# Patient Record
Sex: Female | Born: 1965 | Race: White | Hispanic: No | Marital: Married | Smoking: Never smoker
Health system: Southern US, Community
[De-identification: ages and names within clinical notes are randomized; demographics above are authoritative.]

## PROBLEM LIST (undated history)

## (undated) DIAGNOSIS — T7840XA Allergy, unspecified, initial encounter: Secondary | ICD-10-CM

## (undated) DIAGNOSIS — C801 Malignant (primary) neoplasm, unspecified: Secondary | ICD-10-CM

## (undated) DIAGNOSIS — E079 Disorder of thyroid, unspecified: Secondary | ICD-10-CM

## (undated) DIAGNOSIS — F419 Anxiety disorder, unspecified: Secondary | ICD-10-CM

## (undated) HISTORY — PX: POLYPECTOMY: SHX149

## (undated) HISTORY — DX: Allergy, unspecified, initial encounter: T78.40XA

## (undated) HISTORY — DX: Malignant (primary) neoplasm, unspecified: C80.1

## (undated) HISTORY — DX: Anxiety disorder, unspecified: F41.9

## (undated) HISTORY — PX: OTHER SURGICAL HISTORY: SHX169

## (undated) HISTORY — DX: Disorder of thyroid, unspecified: E07.9

## (undated) HISTORY — PX: COLONOSCOPY: SHX174

---

## 2009-12-16 ENCOUNTER — Encounter (INDEPENDENT_AMBULATORY_CARE_PROVIDER_SITE_OTHER): Payer: Self-pay | Admitting: *Deleted

## 2010-01-25 ENCOUNTER — Encounter (INDEPENDENT_AMBULATORY_CARE_PROVIDER_SITE_OTHER): Payer: Self-pay | Admitting: *Deleted

## 2010-01-25 ENCOUNTER — Ambulatory Visit: Payer: Self-pay | Admitting: Gastroenterology

## 2010-01-25 DIAGNOSIS — K625 Hemorrhage of anus and rectum: Secondary | ICD-10-CM

## 2010-01-25 DIAGNOSIS — K602 Anal fissure, unspecified: Secondary | ICD-10-CM

## 2010-03-10 ENCOUNTER — Ambulatory Visit
Admission: RE | Admit: 2010-03-10 | Discharge: 2010-03-10 | Payer: Self-pay | Source: Home / Self Care | Attending: Gastroenterology | Admitting: Gastroenterology

## 2010-03-10 ENCOUNTER — Encounter: Payer: Self-pay | Admitting: Gastroenterology

## 2010-03-14 ENCOUNTER — Encounter: Payer: Self-pay | Admitting: Gastroenterology

## 2010-03-22 ENCOUNTER — Telehealth: Payer: Self-pay | Admitting: Gastroenterology

## 2010-03-28 NOTE — Letter (Signed)
Summary: New Patient letter  [facility]  [address] [place], [place] [address]   Phone: [phone]  Fax: [phone]       12/16/2009 MRN: [id number]  Andrea Sharp [address] [place], [place]  [address]  Dear Ms. Eustice,  Welcome to the Gastroenterology Division at [facility].    You are scheduled to see Dr. Christella Hartigan on 01/25/2010 at 8:30AM on the 3rd floor at [facility], [address]  We ask that you try to arrive at our office 15 minutes prior to your appointment time to allow for check-in.  We would like you to complete the enclosed self-administered evaluation form prior to your visit and bring it with you on the day of your appointment.  We will review it with you.  Also, please bring a complete list of all your medications or, if you prefer, bring the medication bottles and we will list them.  Please bring your insurance card so that we may make a copy of it.  If your insurance requires a referral to see a specialist, please bring your referral form from your primary care physician.  Co-payments are due at the time of your visit and may be paid by cash, check or credit card.     Your office visit will consist of a consult with your physician (includes a physical exam), any laboratory testing he/she may order, scheduling of any necessary diagnostic testing (e.g. x-ray, ultrasound, CT-scan), and scheduling of a procedure (e.g. Endoscopy, Colonoscopy) if required.  Please allow enough time on your schedule to allow for any/all of these possibilities.    If you cannot keep your appointment, please call [phone] to cancel or reschedule prior to your appointment date.  This allows [place] the opportunity to schedule an appointment for another patient in need of care.  If you do not cancel or reschedule by 5 p.m. the business day prior to your appointment date, you will be charged a $50.00 late cancellation/no-show fee.    Thank you for  choosing  Gastroenterology for your medical needs.  We appreciate the opportunity to care for you.  Please visit [place] at our website  to learn more about our practice.                     Sincerely,                                                             The Gastroenterology Division

## 2010-03-28 NOTE — Assessment & Plan Note (Signed)
History of Present Illness Visit Type: Initial Consult Primary GI MD: Rob Bunting MD Primary Provider: Varney Baas, MD Requesting Provider: Varney Baas, MD Chief Complaint: rectal bleeding History of Present Illness:       very pleasant 45 year old woman.  she noted rectal bleeding about 2 monhts ago.  Had a skin cancer removed from face, put on Abx for 5 days, then diarrhea for 2 weeks (going 2-3 times a day, very loose).  Bleeding started shortly afterwards.  Her bowels are more normal now.  Has tearing pain at anus with all BMs, bleeds just about every time as well.  has tried prep H for a while, then stopped.  Cleaning with witch hazel.  Not on fiber supplements but does take dulcolax.  Never had colonoscopy. NO abd pains.  Overall she has lost 5-6 pounds (3 young kids, works).  she had a CBC about one to 2 months ago and it was normal.   GI Review of Systems      Denies abdominal pain, acid reflux, belching, bloating, chest pain, dysphagia with liquids, dysphagia with solids, heartburn, loss of appetite, nausea, vomiting, vomiting blood, weight loss, and  weight gain.        Denies anal fissure, black tarry stools, change in bowel habit, constipation, diarrhea, diverticulosis, fecal incontinence, heme positive stool, hemorrhoids, irritable bowel syndrome, jaundice, light color stool, liver problems, rectal bleeding, and  rectal pain.    Current Medications (verified): 1)  Synthroid 100 Mcg Tabs (Levothyroxine Sodium) .Marland Kitchen.. 1 By Mouth Once Daily 2)  Colace 100 Mg Caps (Docusate Sodium) .... Take One By Mouth Once Daily 3)  Multivitamins  Tabs (Multiple Vitamin) .... 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  All other review of systems was otherwise negative.   Vital Signs:  Patient profile:   45 year old female Height:      69 inches Weight:      142.4 pounds BMI:     21.10 Pulse rate:   88 / minute Pulse rhythm:   regular BP sitting:   112 / 68  (left arm) Cuff size:   regular  Vitals Entered By: Harlow Mares CMA Duncan Dull) (January 25, 2010 8:25 AM)  Physical Exam  Additional Exam:  Constitutional: generally well appearing Psychiatric: alert and oriented times 3 Eyes: extraocular movements intact Mouth: oropharynx moist, no lesions Neck: supple, no lymphadenopathy Cardiovascular: heart regular rate and rythm Lungs: CTA bilaterally Abdomen: soft, non-tender, non-distended, no obvious ascites, no peritoneal signs, normal bowel sounds Extremities: no lower extremity edema bilaterally Skin: no lesions on visible extremities rectal examination with female assistant in room: 1 cm, typical appearing posterior, midline anal fissure that was not currently bleeding. It was slightly tender. Internal palpation revealed no rectal masses. There are no hemorrhoids.   Impression & Recommendations:  Problem # 1:  anal fissure, rectal bleeding, anal pain I suspect her anal fissure is causing the bleeding  and the anal discomfort. Her father had colon polyps and she has never had a full colonoscopy and so we will arrange that to be done at her soonest convenience to make sure there are no other causes of her rectal bleeding. In the meantime she will begin usual seizure treatments with sits baths, topical prescription strength cream, fiber supplements.  Patient Instructions: 1)  You  will be scheduled to have a colonoscopy. 2)  You should begin taking citrucel powder fiber supplement (orange flavor).  Start with a small spoonful and increase this over 1 week to a full, heaping spoonful daily.  You may notice some bloating when you first start the fiber, but that usually resolves after a few days. 3)  Stop colace. 4)  Sitz baths, one to twice a day. 5)  Analpram ointment to fissure once to twice daily. 6)  A copy of this information will be sent to Dr. Jennette Kettle. 7)  The medication list was reviewed and reconciled.  All changed / newly prescribed medications were explained.  A complete medication list was provided to the patient / caregiver. Prescriptions: HYDROCORTISONE ACE-PRAMOXINE 2.5-1 % CREA (HYDROCORTISONE ACE-PRAMOXINE) apply to anus once to twice daily  #1 month x 3   Entered and Authorized by:   Rachael Fee MD   Signed by:   Rachael Fee MD on 01/25/2010   Method used:   Electronically to        [facility] # [phone]* (retail)       [address]       [place], [place]  [address]       Ph: [phone]       Fax: [phone]   RxID:   [id number]   Appended Document: Orders Update/Movi    Clinical Lists Changes  Problems: Added new problem of HEMORRHAGE OF RECTUM AND ANUS (ICD-569.3) Added new problem of ANAL FISSURE (ICD-565.0) Medications: Added new medication of MOVIPREP 100 GM  SOLR (PEG-KCL-NACL-NASULF-NA ASC-C) As per prep instructions. - Signed Rx of MOVIPREP 100 GM  SOLR (PEG-KCL-NACL-NASULF-NA ASC-C) As per prep instructions.;  #1 x 0;  Signed;  Entered by: Chales Abrahams CMA (AAMA);  Authorized by: Rachael Fee MD;  Method used: Electronically to [facility] # [address]*, [address] [place], [place]  [address], Ph: [phone], Fax: [phone] Orders: Added new Test order of Colonoscopy (Colon) - Signed    Prescriptions: MOVIPREP 100 GM  SOLR (PEG-KCL-NACL-NASULF-NA ASC-C) As per prep instructions.  #1  x 0   Entered by:   Chales Abrahams CMA (AAMA)   Authorized by:   Rachael Fee MD   Signed by:   Chales Abrahams CMA (AAMA) on 01/25/2010   Method used:   Electronically to        [facility] # [address]* (retail)       [address]       [place], [place]  [address]       Ph: [phone]       Fax: [phone]   RxID:   [id number]

## 2010-03-28 NOTE — Letter (Signed)
Summary: [facility] Instructions  [facility]  [address] [place], [place] [address]   Phone: [phone]  Fax: [phone]       [name]    [birth year]    MRN: [id number]        Procedure Day /Date:03/10/10 FRI     Arrival Time:2 pm     Procedure Time:3 pm     Location of Procedure:                    X  [facility] (4th Floor)                        PREPARATION FOR COLONOSCOPY WITH MOVIPREP   Starting 5 days prior to your procedure 03/05/10 do not eat nuts, seeds, popcorn, corn, beans, peas,  salads, or any raw vegetables.  Do not take any fiber supplements (e.g. Metamucil, Citrucel, and Benefiber).  THE DAY BEFORE YOUR PROCEDURE         DATE:03/09/10  DAY: THURS  1.  Drink clear liquids the entire day-NO SOLID FOOD  2.  Do not drink anything colored red or purple.  Avoid juices with pulp.  No orange juice.  3.  Drink at least 64 oz. (8 glasses) of fluid/clear liquids during the day to prevent dehydration and help the prep work efficiently.  CLEAR LIQUIDS INCLUDE: Water Jello Ice Popsicles Tea (sugar ok, no milk/cream) Powdered fruit flavored drinks Coffee (sugar ok, no milk/cream) Gatorade Juice: apple, white grape, white cranberry  Lemonade Clear bullion, consomm, broth Carbonated beverages (any kind) Strained chicken noodle soup Hard Candy                             4.  In the morning, mix first dose of MoviPrep solution:    Empty 1 Pouch A and 1 Pouch B into the disposable container    Add lukewarm drinking water to the top line of the container. Mix to dissolve    Refrigerate (mixed solution should be used within 24 hrs)  5.  Begin drinking the prep at 5:00 p.m. The MoviPrep container is divided by 4 marks.   Every 15 minutes drink the solution down to the next mark (approximately 8 oz) until the full liter is complete.   6.  Follow completed prep with 16 oz of clear liquid of your choice (Nothing red or purple).   Continue to drink clear liquids until bedtime.  7.  Before going to bed, mix second dose of MoviPrep solution:    Empty 1 Pouch A and 1 Pouch B into the disposable container    Add lukewarm drinking water to the top line of the container. Mix to dissolve    Refrigerate  THE DAY OF YOUR PROCEDURE      DATE: 03/10/10 DAY: Andrea Sharp  Beginning at 10 a.m. (5 hours before procedure):         1. Every 15 minutes, drink the solution down to the next mark (approx 8 oz) until the full liter is complete.  2. Follow completed prep with 16 oz. of clear liquid of your choice.    3. You may drink clear liquids until 1 pm (2 HOURS BEFORE PROCEDURE).   MEDICATION INSTRUCTIONS  Unless otherwise instructed, you should take regular prescription medications with a small sip of water   as early as possible the morning of your procedure.  OTHER INSTRUCTIONS  You will need a responsible adult at least 43 years of age to accompany you and drive you home.   This person must remain in the waiting room during your procedure.  Wear loose fitting clothing that is easily removed.  Leave jewelry and other valuables at home.  However, you may wish to bring a book to read or  an iPod/MP3 player to listen to music as you wait for your procedure to start.  Remove all body piercing jewelry and leave at home.  Total time from sign-in until discharge is approximately 2-3 hours.  You should go home directly after your procedure and rest.  You can resume normal activities the  day after your procedure.  The day of your procedure you should not:   Drive   Make legal decisions   Operate machinery   Drink alcohol   Return to work  You will receive specific instructions about eating, activities and medications before you leave.    The above instructions have been reviewed and explained to me by   _______________________    I fully understand and can verbalize these instructions  _____________________________ Date _________

## 2010-03-30 NOTE — Progress Notes (Signed)
Summary: Questions about biopsy results  Phone Note Call from Patient Call back at Home Phone [phone]   Caller: Patient Call For: Dr. Christella Hartigan Reason for Call: Talk to Nurse Summary of Call: Has some questions about her biopsy results Initial call taken by: Karna Christmas,  March 22, 2010 9:06 AM  Follow-up for Phone Call        unable to reach pt the number given has been set up to not accept blocked calls, I tried to use the *82 and was still unable to reach the pt. Follow-up by: Chales Abrahams CMA (AAMA),  March 22, 2010 10:12 AM

## 2010-03-30 NOTE — Procedures (Signed)
Summary: Colonoscopy  Patient: Andrea Sharp Note: All result statuses are Final unless otherwise noted.  Tests: (1) Colonoscopy (COL)   COL Colonoscopy           DONE     [facility]     [address]     [place], [place]  [address]           COLONOSCOPY PROCEDURE REPORT           PATIENT:  Andrea Sharp, Andrea Sharp  MR#:  [id number]     BIRTHDATE:  February 17, 1966, 44 yrs. old  GENDER:  female     ENDOSCOPIST:  Rachael Fee, MD     REF. BY:  Varney Baas, M.D.     PROCEDURE DATE:  03/10/2010     PROCEDURE:  Colonoscopy with snare polypectomy     ASA CLASS:  Class II     INDICATIONS:  father had colon polyps, intermittent rectal     bleeding     MEDICATIONS:   Fentanyl 50 mcg IV, Versed 5 mg IV           DESCRIPTION OF PROCEDURE:   After the risks benefits and     alternatives of the procedure were thoroughly explained, informed     consent was obtained.  Digital rectal exam was performed and     revealed no rectal masses.   The LB [id number] endoscope     was introduced through the anus and advanced to the cecum, which     was identified by both the appendix and ileocecal valve, without     limitations.  The quality of the prep was good, using MoviPrep.     The instrument was then slowly withdrawn as the colon was fully     examined.     <<PROCEDUREIMAGES>>           FINDINGS:  A pedunculated polyp was found in the ascending colon.     This was 13mm across, was removed with snare/cautery and sent to     pathology (jar 1) (see image3 and image4).  This was otherwise a     normal examination of the colon (see image5, image2, and image1).     Retroflexed views in the rectum revealed no abnormalities.    The     scope was then withdrawn from the patient and the procedure     completed.           COMPLICATIONS:  None     ENDOSCOPIC IMPRESSION:     1) Pedunculated polyp in the ascending colon, removed and sent     to pathology     2) Otherwise normal examination       RECOMMENDATIONS:     1) If the polyp(s) removed today are proven to be adenomatous     (pre-cancerous) polyps, you will need a colonoscopy in 3 years.     Otherwise you should continue to follow colorectal cancer     screening guidelines for "routine risk" patients with a     colonoscopy in 10 years.     2) You will receive a letter within 1-2 weeks with the results     of your biopsy as well as final recommendations. Please call my     office if you have not received a letter after 3 weeks.           ______________________________     Rachael Fee, MD  n.     eSIGNED:   Rachael Fee at 03/10/2010 02:58 PM           Danice Goltz, [id number]  Note: An exclamation mark (!) indicates a result that was not dispersed into the flowsheet. Document Creation Date: 03/10/2010 2:58 PM _______________________________________________________________________  (1) Order result status: Final Collection or observation date-time: 03/10/2010 14:54 Requested date-time:  Receipt date-time:  Reported date-time:  Referring Physician:   Ordering Physician: Rob Bunting [phone]) Specimen Source:  Source: Launa Grill Order Number: [id number] Lab site:   Appended Document: Colonoscopy     Procedures Next Due Date:    Colonoscopy: 02/2013

## 2010-03-30 NOTE — Letter (Signed)
Summary: Results Letter  [facility]  [address] [place], [place] [address]   Phone: [phone]  Fax: [phone]        March 14, 2010 MRN: [id number]    SERAPHIM AFFINITO [address] [place], [place]  [address]    Dear Ms. Shadle,   The polyp removed during your recent procedure was proven to be adenomatous.  These are pre-cancerous polyps that may have grown into cancers if they had not been removed.  Based on current nationally recognized surveillance guidelines, I recommend that you have a repeat colonoscopy in 3 years.   We will therefore put your information in our reminder system and will contact you in 3 years to schedule a repeat procedure.  Please call if you have any questions or concerns.       Sincerely,  Rachael Fee MD  This letter has been electronically signed by your physician.  Appended Document: Results Letter Letter mailed

## 2010-09-05 ENCOUNTER — Other Ambulatory Visit: Payer: Self-pay | Admitting: Dermatology

## 2010-12-14 ENCOUNTER — Other Ambulatory Visit: Payer: Self-pay | Admitting: Dermatology

## 2011-11-07 ENCOUNTER — Other Ambulatory Visit: Payer: Self-pay

## 2012-12-08 ENCOUNTER — Other Ambulatory Visit: Payer: Self-pay | Admitting: Obstetrics & Gynecology

## 2012-12-08 DIAGNOSIS — R922 Inconclusive mammogram: Secondary | ICD-10-CM

## 2012-12-08 DIAGNOSIS — Z803 Family history of malignant neoplasm of breast: Secondary | ICD-10-CM

## 2013-01-09 ENCOUNTER — Encounter: Payer: Self-pay | Admitting: Gastroenterology

## 2013-01-14 ENCOUNTER — Encounter: Payer: Self-pay | Admitting: Gastroenterology

## 2013-03-09 ENCOUNTER — Ambulatory Visit (AMBULATORY_SURGERY_CENTER): Payer: Self-pay | Admitting: *Deleted

## 2013-03-09 VITALS — Ht 69.0 in | Wt 147.0 lb

## 2013-03-09 DIAGNOSIS — Z860100 Personal history of colon polyps, unspecified: Secondary | ICD-10-CM

## 2013-03-09 MED ORDER — MOVIPREP 100 G PO SOLR: ORAL | Status: DC

## 2013-03-09 NOTE — Progress Notes (Signed)
Patient denies any allergies to eggs or soy. Patient denies any problems with anesthesia.  

## 2013-03-11 ENCOUNTER — Encounter: Payer: Self-pay | Admitting: Gastroenterology

## 2013-03-23 ENCOUNTER — Ambulatory Visit (AMBULATORY_SURGERY_CENTER): Payer: BC Managed Care – PPO | Admitting: Gastroenterology

## 2013-03-23 ENCOUNTER — Encounter: Payer: Self-pay | Admitting: Gastroenterology

## 2013-03-23 VITALS — BP 112/73 | HR 71 | Temp 97.7°F | Resp 15 | Ht 69.0 in | Wt 147.0 lb

## 2013-03-23 DIAGNOSIS — Z860100 Personal history of colon polyps, unspecified: Secondary | ICD-10-CM

## 2013-03-23 MED ORDER — SODIUM CHLORIDE 0.9 % IV SOLN: 500.0000 mL | INTRAVENOUS | Status: DC

## 2013-03-23 NOTE — Op Note (Signed)
[facility]  [address] [place], [address]   COLONOSCOPY PROCEDURE REPORT  PATIENT: Andrea Sharp, Andrea Sharp  MR#: [id number] BIRTHDATE: [birth year] , 89  yrs. old GENDER: Female ENDOSCOPIST: Milus Banister, MD PROCEDURE DATE:  03/23/2013 PROCEDURE:   Colonoscopy, surveillance First Screening Colonoscopy - Avg.  risk and is 50 yrs.  old or older - No.  Prior Negative Screening - Now for repeat screening. N/A  History of Adenoma - Now for follow-up colonoscopy & has been > or = to 3 yrs.  Yes hx of adenoma.  Has been 3 or more years since last colonoscopy.  Polyps Removed Today? No.  Recommend repeat exam, <10 yrs? Yes.  High risk (family or personal hx). ASA CLASS:   Class II INDICATIONS:20mm TVA removed in 2012 (done for rectal bleeding). MEDICATIONS: Fentanyl 50 mcg IV, Versed 6 mg IV, and These medications were titrated to patient response per physician's verbal order  DESCRIPTION OF PROCEDURE:   After the risks benefits and alternatives of the procedure were thoroughly explained, informed consent was obtained.  A digital rectal exam revealed no abnormalities of the rectum.   The Pentax Pediatric Colonoscope [id number]  endoscope was introduced through the anus and advanced to the cecum, which was identified by both the appendix and ileocecal valve. No adverse events experienced.   The quality of the prep was good.  The instrument was then slowly withdrawn as the colon was fully examined.   COLON FINDINGS: A normal appearing cecum, ileocecal valve, and appendiceal orifice were identified.  The ascending, hepatic flexure, transverse, splenic flexure, descending, sigmoid colon and rectum appeared unremarkable.  No polyps or cancers were seen. Retroflexed views revealed no abnormalities. The time to cecum=4 minutes 30 seconds.  Withdrawal time=9 minutes 20 seconds.  The scope was withdrawn and the procedure completed. COMPLICATIONS: There were no  complications.  ENDOSCOPIC IMPRESSION: Normal colon No polyps or cancers  RECOMMENDATIONS: Given your personal history of adenomatous (pre-cancerous) polyps, you will need a repeat colonoscopy in 5 years.   eSigned:  Milus Banister, MD 03/23/2013 9:45 AM   cc: Lujean Amel, MD

## 2013-03-23 NOTE — Patient Instructions (Signed)
YOU HAD AN ENDOSCOPIC PROCEDURE TODAY AT THE [facility]: Refer to the procedure report that was given to you for any specific questions about what was found during the examination.  If the procedure report does not answer your questions, please call your gastroenterologist to clarify.  If you requested that your care partner not be given the details of your procedure findings, then the procedure report has been included in a sealed envelope for you to review at your convenience later.  YOU SHOULD EXPECT: Some feelings of bloating in the abdomen. Passage of more gas than usual.  Walking can help get rid of the air that was put into your GI tract during the procedure and reduce the bloating. If you had a lower endoscopy (such as a colonoscopy or flexible sigmoidoscopy) you may notice spotting of blood in your stool or on the toilet paper. If you underwent a bowel prep for your procedure, then you may not have a normal bowel movement for a few days.  DIET: Your first meal following the procedure should be a light meal and then it is ok to progress to your normal diet.  A half-sandwich or bowl of soup is an example of a good first meal.  Heavy or fried foods are harder to digest and may make you feel nauseous or bloated.  Likewise meals heavy in dairy and vegetables can cause extra gas to form and this can also increase the bloating.  Drink plenty of fluids but you should avoid alcoholic beverages for 24 hours.  ACTIVITY: Your care partner should take you home directly after the procedure.  You should plan to take it easy, moving slowly for the rest of the day.  You can resume normal activity the day after the procedure however you should NOT DRIVE or use heavy machinery for 24 hours (because of the sedation medicines used during the test).    SYMPTOMS TO REPORT IMMEDIATELY: A gastroenterologist can be reached at any hour.  During normal business hours, 8:30 AM to 5:00 PM Monday through Friday,  call [phone].  After hours and on weekends, please call the GI answering service at [phone] who will take a message and have the physician on call contact you.   Following lower endoscopy (colonoscopy or flexible sigmoidoscopy):  Excessive amounts of blood in the stool  Significant tenderness or worsening of abdominal pains  Swelling of the abdomen that is new, acute  Fever of 100F or higher    FOLLOW UP: If any biopsies were taken you will be contacted by phone or by letter within the next 1-3 weeks.  Call your gastroenterologist if you have not heard about the biopsies in 3 weeks.  Our staff will call the home number listed on your records the next business day following your procedure to check on you and address any questions or concerns that you may have at that time regarding the information given to you following your procedure. This is a courtesy call and so if there is no answer at the home number and we have not heard from you through the emergency physician on call, we will assume that you have returned to your regular daily activities without incident.  SIGNATURES/CONFIDENTIALITY: You and/or your care partner have signed paperwork which will be entered into your electronic medical record.  These signatures attest to the fact that that the information above on your After Visit Summary has been reviewed and is understood.  Full responsibility of the confidentiality   of this discharge information lies with you and/or your care-partner.     

## 2013-03-24 ENCOUNTER — Telehealth: Payer: Self-pay | Admitting: *Deleted

## 2013-03-24 NOTE — Telephone Encounter (Signed)
Name identifier, left message, follow-up 

## 2013-04-06 ENCOUNTER — Ambulatory Visit
Admission: RE | Admit: 2013-04-06 | Discharge: 2013-04-06 | Disposition: A | Discharge status: Home / Self Care | Payer: BC Managed Care – PPO | Source: Ambulatory Visit | Attending: Obstetrics & Gynecology | Admitting: Obstetrics & Gynecology

## 2013-04-06 DIAGNOSIS — Z803 Family history of malignant neoplasm of breast: Secondary | ICD-10-CM

## 2013-04-06 DIAGNOSIS — R922 Inconclusive mammogram: Secondary | ICD-10-CM

## 2013-04-06 MED ORDER — GADOBENATE DIMEGLUMINE 529 MG/ML IV SOLN
14.0000 mL | Freq: Once | INTRAVENOUS | Status: AC | PRN
  Administered 2013-04-06: 14 mL via INTRAVENOUS

## 2013-11-11 ENCOUNTER — Encounter: Payer: Self-pay | Admitting: Gastroenterology

## 2013-11-23 ENCOUNTER — Other Ambulatory Visit: Payer: Self-pay | Admitting: Obstetrics & Gynecology

## 2013-11-25 LAB — CYTOLOGY - PAP

## 2014-11-30 ENCOUNTER — Other Ambulatory Visit: Payer: Self-pay | Admitting: Obstetrics & Gynecology

## 2014-12-01 LAB — CYTOLOGY - PAP

## 2018-03-02 ENCOUNTER — Encounter: Payer: Self-pay | Admitting: Gastroenterology

## 2018-03-28 ENCOUNTER — Encounter: Payer: Self-pay | Admitting: Gastroenterology

## 2018-04-16 ENCOUNTER — Encounter: Payer: Self-pay | Admitting: Gastroenterology

## 2018-04-16 ENCOUNTER — Ambulatory Visit (AMBULATORY_SURGERY_CENTER): Payer: Self-pay | Admitting: *Deleted

## 2018-04-16 VITALS — Ht 69.0 in | Wt 148.0 lb

## 2018-04-16 DIAGNOSIS — Z86010 Personal history of colonic polyps: Secondary | ICD-10-CM

## 2018-04-16 MED ORDER — PEG 3350-KCL-NA BICARB-NACL 420 G PO SOLR: 4000.0000 mL | Freq: Once | ORAL | 0 refills | Status: AC

## 2018-04-16 NOTE — Progress Notes (Signed)
No egg or soy allergy known to patient  No issues with past sedation with any surgeries  or procedures, no past intubation  No diet pills per patient No home 02 use per patient  No blood thinners per patient  Pt denies issues with constipation  No A fib or A flutter  EMMI video sent to pt's e mail -- pt declined   

## 2018-04-30 ENCOUNTER — Encounter: Payer: Self-pay | Admitting: Gastroenterology

## 2018-04-30 ENCOUNTER — Ambulatory Visit (AMBULATORY_SURGERY_CENTER): Payer: BC Managed Care – PPO | Admitting: Gastroenterology

## 2018-04-30 VITALS — BP 115/81 | HR 60 | Temp 98.0°F | Resp 20 | Ht 69.0 in | Wt 148.0 lb

## 2018-04-30 DIAGNOSIS — Z860100 Personal history of colon polyps, unspecified: Secondary | ICD-10-CM

## 2018-04-30 DIAGNOSIS — K621 Rectal polyp: Secondary | ICD-10-CM

## 2018-04-30 DIAGNOSIS — D129 Benign neoplasm of anus and anal canal: Secondary | ICD-10-CM

## 2018-04-30 DIAGNOSIS — D128 Benign neoplasm of rectum: Secondary | ICD-10-CM

## 2018-04-30 MED ORDER — SODIUM CHLORIDE 0.9 % IV SOLN: 500.0000 mL | Freq: Once | INTRAVENOUS | Status: DC

## 2018-04-30 NOTE — Progress Notes (Signed)
Report to PACU, RN, vss, BBS= Clear.  

## 2018-04-30 NOTE — Patient Instructions (Addendum)
Discharge instructions given. Handouts on polyps. Resume previous medications.  YOU HAD AN ENDOSCOPIC PROCEDURE TODAY AT [facility]:   Refer to the procedure report that was given to you for any specific questions about what was found during the examination.  If the procedure report does not answer your questions, please call your gastroenterologist to clarify.  If you requested that your care partner not be given the details of your procedure findings, then the procedure report has been included in a sealed envelope for you to review at your convenience later.  YOU SHOULD EXPECT: Some feelings of bloating in the abdomen. Passage of more gas than usual.  Walking can help get rid of the air that was put into your GI tract during the procedure and reduce the bloating. If you had a lower endoscopy (such as a colonoscopy or flexible sigmoidoscopy) you may notice spotting of blood in your stool or on the toilet paper. If you underwent a bowel prep for your procedure, you may not have a normal bowel movement for a few days.  Please Note:  You might notice some irritation and congestion in your nose or some drainage.  This is from the oxygen used during your procedure.  There is no need for concern and it should clear up in a day or so.  SYMPTOMS TO REPORT IMMEDIATELY:   Following lower endoscopy (colonoscopy or flexible sigmoidoscopy):  Excessive amounts of blood in the stool  Significant tenderness or worsening of abdominal pains  Swelling of the abdomen that is new, acute  Fever of 100F or higher   For urgent or emergent issues, a gastroenterologist can be reached at any hour by calling [phone].   DIET:  We do recommend a small meal at first, but then you may proceed to your regular diet.  Drink plenty of fluids but you should avoid alcoholic beverages for 24 hours.  ACTIVITY:  You should plan to take it easy for the rest of today and you should NOT DRIVE or use heavy  machinery until tomorrow (because of the sedation medicines used during the test).    FOLLOW UP: Our staff will call the number listed on your records the next business day following your procedure to check on you and address any questions or concerns that you may have regarding the information given to you following your procedure. If we do not reach you, we will leave a message.  However, if you are feeling well and you are not experiencing any problems, there is no need to return our call.  We will assume that you have returned to your regular daily activities without incident.  If any biopsies were taken you will be contacted by phone or by letter within the next 1-3 weeks.  Please call us at [phone] if you have not heard about the biopsies in 3 weeks.    SIGNATURES/CONFIDENTIALITY: You and/or your care partner have signed paperwork which will be entered into your electronic medical record.  These signatures attest to the fact that that the information above on your After Visit Summary has been reviewed and is understood.  Full responsibility of the confidentiality of this discharge information lies with you and/or your care-partner.

## 2018-04-30 NOTE — Progress Notes (Signed)
Called to room to assist during endoscopic procedure.  Patient ID and intended procedure confirmed with present staff. Received instructions for my participation in the procedure from the performing physician.  

## 2018-04-30 NOTE — Op Note (Signed)
[facility] Patient Name: Andrea Sharp Procedure Date: 04/30/2018 8:59 AM MRN: [id number] Endoscopist: Milus Banister , MD Age: 53 Referring MD:  Date of Birth: March 02, 1965 Gender: Female Account #: [id number] Procedure:                Colonoscopy Indications:              High risk colon cancer surveillance: Personal                            history of colonic polyps; colonoscopy 2012 single                            72mm pedunculated TVA removed, colonoscopy 2015                            normal Medicines:                Monitored Anesthesia Care Procedure:                Pre-Anesthesia Assessment:                           - Prior to the procedure, a History and Physical                            was performed, and patient medications and                            allergies were reviewed. The patient's tolerance of                            previous anesthesia was also reviewed. The risks                            and benefits of the procedure and the sedation                            options and risks were discussed with the patient.                            All questions were answered, and informed consent                            was obtained. Prior Anticoagulants: The patient has                            taken no previous anticoagulant or antiplatelet                            agents. ASA Grade Assessment: II - A patient with                            mild systemic disease. After reviewing the risks  and benefits, the patient was deemed in                            satisfactory condition to undergo the procedure.                           After obtaining informed consent, the colonoscope                            was passed under direct vision. Throughout the                            procedure, the patient's blood pressure, pulse, and                            oxygen saturations were monitored continuously. The                       Colonoscope was introduced through the anus and                            advanced to the the cecum, identified by                            appendiceal orifice and ileocecal valve. The                            colonoscopy was performed without difficulty. The                            patient tolerated the procedure well. The quality                            of the bowel preparation was good. The ileocecal                            valve, appendiceal orifice, and rectum were                            photographed. Scope In: 9:04:01 AM Scope Out: 9:22:47 AM Scope Withdrawal Time: 0 hours 15 minutes 41 seconds  Total Procedure Duration: 0 hours 18 minutes 46 seconds  Findings:                 Two sessile polyps were found in the rectum. The                            polyps were 2 to 3 mm in size. These polyps were                            removed with a cold snare. Resection and retrieval                            were complete.  The exam was otherwise without abnormality on                            direct and retroflexion views. Complications:            No immediate complications. Estimated blood loss:                            None. Estimated Blood Loss:     Estimated blood loss: none. Impression:               - Two 2 to 3 mm polyps in the rectum, removed with                            a cold snare. Resected and retrieved.                           - The examination was otherwise normal on direct                            and retroflexion views. Recommendation:           - Patient has a contact number available for                            emergencies. The signs and symptoms of potential                            delayed complications were discussed with the                            patient. Return to normal activities tomorrow.                            Written discharge instructions were provided to the                             patient.                           - Resume previous diet.                           - Continue present medications.                           You will receive a letter within 2-3 weeks with the                            pathology results and my final recommendations.                           If the polyp(s) is proven to be 'pre-cancerous' on                            pathology, you will need repeat colonoscopy in 7  years. Milus Banister, MD 04/30/2018 9:26:12 AM This report has been signed electronically.

## 2018-04-30 NOTE — Progress Notes (Signed)
Pt's states no medical or surgical changes since previsit or office visit. 

## 2018-05-01 ENCOUNTER — Telehealth: Payer: Self-pay

## 2018-05-01 NOTE — Telephone Encounter (Signed)
Left message on answering machine. 

## 2018-05-07 ENCOUNTER — Encounter: Payer: Self-pay | Admitting: Gastroenterology

## 2019-04-23 ENCOUNTER — Other Ambulatory Visit: Payer: Self-pay | Admitting: Obstetrics and Gynecology

## 2019-04-23 DIAGNOSIS — Z9189 Other specified personal risk factors, not elsewhere classified: Secondary | ICD-10-CM

## 2019-08-15 ENCOUNTER — Other Ambulatory Visit: Payer: Self-pay

## 2019-08-15 ENCOUNTER — Ambulatory Visit
Admission: RE | Admit: 2019-08-15 | Discharge: 2019-08-15 | Disposition: A | Discharge status: Home / Self Care | Payer: BC Managed Care – PPO | Source: Ambulatory Visit | Attending: Obstetrics and Gynecology | Admitting: Obstetrics and Gynecology

## 2019-08-15 DIAGNOSIS — Z9189 Other specified personal risk factors, not elsewhere classified: Secondary | ICD-10-CM

## 2019-08-15 MED ORDER — GADOBUTROL 1 MMOL/ML IV SOLN
7.0000 mL | Freq: Once | INTRAVENOUS | Status: AC | PRN
  Administered 2019-08-15: 7 mL via INTRAVENOUS

## 2019-12-31 ENCOUNTER — Encounter: Payer: Self-pay | Admitting: Physical Therapy

## 2019-12-31 ENCOUNTER — Ambulatory Visit: Payer: BC Managed Care – PPO | Attending: Family Medicine | Admitting: Physical Therapy

## 2019-12-31 ENCOUNTER — Other Ambulatory Visit: Payer: Self-pay

## 2019-12-31 DIAGNOSIS — M6281 Muscle weakness (generalized): Secondary | ICD-10-CM | POA: Diagnosis present

## 2019-12-31 DIAGNOSIS — G8929 Other chronic pain: Secondary | ICD-10-CM | POA: Diagnosis present

## 2019-12-31 DIAGNOSIS — M545 Low back pain, unspecified: Secondary | ICD-10-CM | POA: Diagnosis present

## 2019-12-31 NOTE — Therapy (Signed)
[facility] [address], [place], [place], [address] Phone: [phone]   Fax:  [phone]  Physical Therapy Evaluation  Patient Details  Name: Andrea Sharp MRN: [id number] Date of Birth: September 03, 1965 Referring Provider (PT): Dr. Lujean Amel    Encounter Date: 12/31/2019   PT End of Session - 12/31/19 0912    Visit Number 1    Date for PT Re-Evaluation 03/10/20    Authorization Type BCBS    PT Start Time 0800    PT Stop Time 0853    PT Time Calculation (min) 53 min    Activity Tolerance Patient tolerated treatment well           Past Medical History:  Diagnosis Date   Allergy    mild   Anxiety    mild   Cancer ([place])    skin cancer- basal cell   Thyroid disease     Past Surgical History:  Procedure Laterality Date   basal cell skin cancer removed     COLONOSCOPY     POLYPECTOMY      There were no vitals filed for this visit.    Subjective Assessment - 12/31/19 0801    Subjective My shoulder has been better.  LBP is variable but most troublesome.  6-9 months.    Right lumbosacral region sharp affecting going to sleep, hard to bend over to put on shoe.  There    Pertinent History knee history, ankle history; used to run right hip/knee internal rotation;  shoe wear pattern on  medial side of  shoe    Limitations House hold activities;Sitting    How long can you sit comfortably? try to change a lot at work b/c it may bother    How long can you walk comfortably? walk OK    Diagnostic tests none    Patient Stated Goals pain in back less to none    Currently in Pain? Yes    Pain Score 3     Pain Location Back    Pain Orientation Right;Posterior    Pain Type Chronic pain    Pain Radiating Towards no thigh or leg symptoms    Pain Onset More than a month ago    Pain Frequency Constant   90% of the time   Aggravating Factors  bending over.  piriformis stretching sitting or supine;  soft  chairs?    Pain Relieving Factors bridge/pelvic tilts in supine              [facility] PT Assessment - 12/31/19 0001      Assessment   Medical Diagnosis right low back pain    Referring Provider (PT) Dr. Lauretta Grill [place]     Onset Date/Surgical Date --   9 months   Next MD Visit as needed     Prior Therapy sister is a PT      Precautions   Precautions None      Restrictions   Weight Bearing Restrictions No      Balance Screen   Has the patient fallen in the past 6 months No      [place] residence    Additional Comments knees hurt on stairs has to use handrail       Prior Function   Vocation Full time employment    Vocation Requirements lots of sitting    Leisure walk with husband outside       Observation/Other Assessments  Focus on Therapeutic Outcomes (FOTO)  39% limitation       AROM   Overall AROM Comments right hip external rotation 30 degrees, left 45 degrees     Right/Left Shoulder --   will defer shoulder assessment and focus on LBP at this time   Lumbar Flexion 68    Lumbar Extension 25    Lumbar - Right Side Bend 32    Lumbar - Left Side Bend 26   right buttock     Strength   Overall Strength Comments will defer shoulder and focus on low back per pt request     Right Hip Extension 4+/5    Right Hip ABduction 4+/5    Left Hip Extension 5/5    Left Hip ABduction 5/5    Lumbar Flexion 4/5    Lumbar Extension 4+/5      Palpation   Palpation comment tender points right gluteals and piriformis      Special Tests   Other special tests mild tenderness PA L4-5      FABER test   findings Positive    Side Right      Slump test   Findings Positive    Side Right      Straight Leg Raise   Findings Positive    Side  Right    Comment 40 degrees       Hip Scouring   Findings Positive    Side Right                      Objective measurements completed on examination: See above findings.        [facility] Adult PT Treatment/Exercise - 12/31/19 0001      Moist Heat Therapy   Number Minutes Moist Heat 3 Minutes    Moist Heat Location Hip      Manual Therapy   Soft tissue mobilization right gluteals and piriformis            Trigger Point Dry Needling - 12/31/19 0001    Consent Given? Yes    Muscles Treated Back/Hip Gluteus minimus;Gluteus medius;Gluteus maximus;Piriformis    Gluteus Minimus Response Palpable increased muscle length    Gluteus Medius Response Palpable increased muscle length    Gluteus Maximus Response Palpable increased muscle length    Piriformis Response Palpable increased muscle length                PT Education - 12/31/19 0849    Education Details dry needling aftercare    Person(s) Educated Patient    Methods Explanation;Demonstration;Handout    Comprehension Returned demonstration;Verbalized understanding            PT Short Term Goals - 12/31/19 0926      PT SHORT TERM GOAL #1   Title The patient will report a 30% improvement in right posterior hip pain with sleeping and home and work ADLS    Time 5    Period Weeks    Status New    Target Date 02/04/20      PT SHORT TERM GOAL #2   Title The patient will have improved left sidebending to 30 degrees and right hip internal rotation (measured in prone) to 40 degrees for improved mobility to put on her shoes      PT SHORT TERM GOAL #3   Title The patient will have improved neural mobility with SLR to 50 degrees needed for greater ease with putting on shoes    Time  5    Period Weeks    Status New             PT Long Term Goals - 12/31/19 0930      PT LONG TERM GOAL #1   Title The patient will be independent in self management and safe self progression of HEP    Time 10    Period Weeks    Status New    Target Date 03/10/20      PT LONG TERM GOAL #2   Title The patient will report a 65% improvement in right posterior hip/back pain with sleeping, putting on shoes and home/work  ADLs    Time 10    Period Weeks    Status New      PT LONG TERM GOAL #3   Title The patient will have improved right hip abduction, extension, and trunk stability muscles to grossly 5-/5 needed for lifting/carrying heavier objects    Time 10    Period Weeks    Status New      PT LONG TERM GOAL #4   Title The patient's FOTO functional outcome score improved from 39% limitation to 27%    Time 10    Period Weeks    Status New      PT LONG TERM GOAL #5   Title --                  Plan - 12/31/19 0914    Clinical Impression Statement The patient is referred to PT for treatment of right low back pain/hip pain and right shoulder pain.  She states her shoulder is feeling better but her right posterior hip/buttock continues to be bothersome and she would like to focus on that area.  She reports her hip pain is aggravated with piriformis type stretches, bending over to put on her shoe and possibly sitting in softer chairs.  She avoids heavy lifting.  She has difficulty falling asleep b/c of the pain.  She feels better doing a bridge/pelvic tilt.  Her lumbar ROM is WFLS except for left sidebending painful and limited.  Decreased right hip internal rotation 15 degrees less than left.  Painful right FABER.+ right SLR at 40 degrees and + slump.  Mild right hip discomfort with scour.  Tender points in right piriformis and gluteals.  Decreased right hip extension and abduction strength and lumbo/pelvic core strength.  She would benefit from PT to address these deficits.    Personal Factors and Comorbidities Time since onset of injury/illness/exacerbation    Examination-Activity Limitations Bend;Sleep;Sit;Lift    Examination-Participation Restrictions Occupation;Other    Stability/Clinical Decision Making Stable/Uncomplicated    Clinical Decision Making Low    Rehab Potential Good    PT Frequency 1x / week    PT Duration Other (comment)   10 weeks   PT Treatment/Interventions ADLs/Self Care  Home Management;Cryotherapy;Electrical Stimulation;Ultrasound;Traction;Moist Heat;Iontophoresis 4mg /ml Dexamethasone;Therapeutic activities;Therapeutic exercise;Neuromuscular re-education;Manual techniques;Patient/family education;Dry needling;Taping;Spinal Manipulations    PT Next Visit Plan assess response to DN of glutes and piriformis;  start QL and lat stretches on right; begin glute med strengthening, internal oblique, lumbar multifidi and lat strengthening    PT Home Exercise Plan start [place];  pt to restart bird dogs as she has done before and continue pelvic tilts    Recommended Other Services assess shoulder at a later date if needed    Consulted and Agree with Plan of Care Patient           Patient will  benefit from skilled therapeutic intervention in order to improve the following deficits and impairments:  Decreased range of motion, Increased fascial restricitons, Pain, Decreased strength  Visit Diagnosis: Chronic right-sided low back pain without sciatica - Plan: PT plan of care cert/re-cert  Muscle weakness (generalized) - Plan: PT plan of care cert/re-cert     Problem List Patient Active Problem List   Diagnosis Date Noted   ANAL FISSURE 01/25/2010   HEMORRHAGE OF RECTUM AND ANUS 01/25/2010   Ruben Im, PT 12/31/19 9:42 AM Phone: [phone] Fax: [phone] Alvera Singh 12/31/2019, 9:42 AM  [facility] [address], [place], [place], [address] Phone: [phone]   Fax:  [phone]  Name: Andrea Sharp MRN: [id number] Date of Birth: May 29, 1965

## 2019-12-31 NOTE — Patient Instructions (Signed)

## 2020-01-07 ENCOUNTER — Other Ambulatory Visit: Payer: Self-pay

## 2020-01-07 ENCOUNTER — Ambulatory Visit: Payer: BC Managed Care – PPO | Admitting: Physical Therapy

## 2020-01-07 DIAGNOSIS — M6281 Muscle weakness (generalized): Secondary | ICD-10-CM

## 2020-01-07 DIAGNOSIS — G8929 Other chronic pain: Secondary | ICD-10-CM

## 2020-01-07 DIAGNOSIS — M545 Low back pain, unspecified: Secondary | ICD-10-CM | POA: Diagnosis not present

## 2020-01-07 NOTE — Therapy (Signed)
[facility] [address] [place], [place], [address] Phone: [phone]   Fax:  [phone]  Physical Therapy Treatment  Patient Details  Name: Andrea Sharp MRN: [id number] Date of Birth: [birth year] Referring Provider (PT): Dr. Lujean Amel    Encounter Date: 01/07/2020   PT End of Session - 01/07/20 1948    Visit Number 2    Date for PT Re-Evaluation 03/10/20    Authorization Type BCBS    PT Start Time 0848    PT Stop Time 0930    PT Time Calculation (min) 42 min    Activity Tolerance Patient tolerated treatment well           Past Medical History:  Diagnosis Date  . Allergy    mild  . Anxiety    mild  . Cancer ([place])    skin cancer- basal cell  . Thyroid disease     Past Surgical History:  Procedure Laterality Date  . basal cell skin cancer removed    . COLONOSCOPY    . POLYPECTOMY      There were no vitals filed for this visit.   Subjective Assessment - 01/07/20 0850    Subjective Liked the DN, less guarding.  More movement.  Achiness right SI region and with palpation right glute.  Still doing bridges/pelvic tilts and bird dogs at home.    Pertinent History knee history, ankle history; used to run right hip/knee internal rotation;  shoe wear pattern on  medial side of  shoe    How long can you sit comfortably? try to change a lot at work b/c it may bother    Currently in Pain? Yes    Pain Score 2     Pain Location Hip    Pain Orientation Right                             [facility] Adult PT Treatment/Exercise - 01/07/20 0001      Lumbar Exercises: Stretches   Other Lumbar Stretch Exercise doorway psoas stretch with UE elevation and reach overs right only 2x 5       Lumbar Exercises: Supine   Ab Set 5 reps    Other Supine Lumbar Exercises transverse abdominal and internal oblique activation    Other Supine Lumbar Exercises UE extension isometric and opposite hip  extension isometric 5 sec hold 5x each side       Lumbar Exercises: Sidelying   Clam Limitations attempted but too painful so discontinued       Lumbar Exercises: Prone   Other Prone Lumbar Exercises UE extension with opposite hip extension 5x right/left       Moist Heat Therapy   Number Minutes Moist Heat 3 Minutes    Moist Heat Location Hip      Manual Therapy   Soft tissue mobilization right gluteals and piriformis            Trigger Point Dry Needling - 01/07/20 0001    Consent Given? Yes    Muscles Treated Back/Hip Gluteus minimus;Gluteus medius;Gluteus maximus;Piriformis    Gluteus Minimus Response Palpable increased muscle length    Gluteus Medius Response Palpable increased muscle length    Gluteus Maximus Response Palpable increased muscle length    Piriformis Response Palpable increased muscle length                PT Education - 01/07/20 1947  Education Details transverse abdominus and internal oblique activation supine; prone UE extension/LE extension opposites;  doorway stretch    Person(s) Educated Patient    Methods Explanation;Demonstration;Handout    Comprehension Returned demonstration;Verbalized understanding            PT Short Term Goals - 12/31/19 0926      PT SHORT TERM GOAL #1   Title The patient will report a 30% improvement in right posterior hip pain with sleeping and home and work ADLS    Time 5    Period Weeks    Status New    Target Date 02/04/20      PT SHORT TERM GOAL #2   Title The patient will have improved left sidebending to 30 degrees and right hip internal rotation (measured in prone) to 40 degrees for improved mobility to put on her shoes      PT SHORT TERM GOAL #3   Title The patient will have improved neural mobility with SLR to 50 degrees needed for greater ease with putting on shoes    Time 5    Period Weeks    Status New             PT Long Term Goals - 12/31/19 0930      PT LONG TERM GOAL #1   Title  The patient will be independent in self management and safe self progression of HEP    Time 10    Period Weeks    Status New    Target Date 03/10/20      PT LONG TERM GOAL #2   Title The patient will report a 65% improvement in right posterior hip/back pain with sleeping, putting on shoes and home/work ADLs    Time 10    Period Weeks    Status New      PT LONG TERM GOAL #3   Title The patient will have improved right hip abduction, extension, and trunk stability muscles to grossly 5-/5 needed for lifting/carrying heavier objects    Time 10    Period Weeks    Status New      PT LONG TERM GOAL #4   Title The patient's FOTO functional outcome score improved from 39% limitation to 27%    Time 10    Period Weeks    Status New      PT LONG TERM GOAL #5   Title --                 Plan - 01/07/20 1948    Clinical Impression Statement The patient has multiple tender points in right gluteals and piriformis but with a positive response to DN and soft tissue mobilization.  She is instructed in right doorway psoas and quadratus lumborum stretching and initiated lumbo/pelvic strengthening with focus on activation of lats, internal obliques, multifidi and transverse abdominus muscles.  Attempted sidelying clams but symptoms quickly exacerbated.   Therapist closely monitoring response with all treatment interventions.    Rehab Potential Good    PT Frequency 1x / week    PT Duration Other (comment)    PT Treatment/Interventions ADLs/Self Care Home Management;Cryotherapy;Electrical Stimulation;Ultrasound;Traction;Moist Heat;Iontophoresis 4mg /ml Dexamethasone;Therapeutic activities;Therapeutic exercise;Neuromuscular re-education;Manual techniques;Patient/family education;Dry needling;Taping;Spinal Manipulations    PT Next Visit Plan assess response to DN #2 of glutes and piriformis;  review core strengthening as needed;  initate neural floss    PT Home Exercise Plan [id number]            Patient will benefit from  skilled therapeutic intervention in order to improve the following deficits and impairments:  Decreased range of motion, Increased fascial restricitons, Pain, Decreased strength  Visit Diagnosis: Chronic right-sided low back pain without sciatica  Muscle weakness (generalized)     Problem List Patient Active Problem List   Diagnosis Date Noted  . ANAL FISSURE 01/25/2010  . HEMORRHAGE OF RECTUM AND ANUS 01/25/2010   Ruben Im, PT 01/07/20 7:57 PM Phone: [phone] Fax: [phone] Alvera Singh 01/07/2020, 7:56 PM  [facility] [address] [place], [place], [address] Phone: [phone]   Fax:  [phone]  Name: Jessye Imhoff MRN: [id number] Date of Birth: [birth year]

## 2020-01-07 NOTE — Patient Instructions (Signed)
Access Code: [email or web address] URL: [email or web address] Date: 01/07/2020 Prepared by: Ruben Im  Exercises Supine Transversus Abdominis Bracing - Hands on Stomach - 1 x daily - 7 x weekly - 1 sets - 5 reps Supine Pursed Lip Breathing - 1 x daily - 7 x weekly - 1 sets - 5 reps Supine ASLR with Ab Bracing and Shoulder Extension with Anchored Resistance - 1 x daily - 7 x weekly - 1 sets - 5 reps Prone Hip Extension - One Pillow - 1 x daily - 7 x weekly - 1 sets - 10 reps Single Arm Doorway Pec Stretch at 120 Degrees Abduction - 1 x daily - 7 x weekly - 2 sets - 5 reps

## 2020-01-14 ENCOUNTER — Other Ambulatory Visit: Payer: Self-pay

## 2020-01-14 ENCOUNTER — Ambulatory Visit: Payer: BC Managed Care – PPO | Admitting: Physical Therapy

## 2020-01-14 ENCOUNTER — Encounter: Payer: Self-pay | Admitting: Physical Therapy

## 2020-01-14 DIAGNOSIS — M6281 Muscle weakness (generalized): Secondary | ICD-10-CM

## 2020-01-14 DIAGNOSIS — G8929 Other chronic pain: Secondary | ICD-10-CM

## 2020-01-14 DIAGNOSIS — M545 Low back pain, unspecified: Secondary | ICD-10-CM | POA: Diagnosis not present

## 2020-01-14 NOTE — Therapy (Signed)
[facility] [address] [place], [place], [address] Phone: [phone]   Fax:  [phone]  Physical Therapy Treatment  Patient Details  Name: Andrea Sharp MRN: [id number] Date of Birth: 17-Jun-1965 Referring Provider (PT): Dr. Lujean Amel    Encounter Date: 01/14/2020   PT End of Session - 01/14/20 0842    Visit Number 3    Date for PT Re-Evaluation 03/10/20    Authorization Type BCBS    PT Start Time 0759    PT Stop Time 0838    PT Time Calculation (min) 39 min    Activity Tolerance Patient tolerated treatment well    Behavior During Therapy [facility] for tasks assessed/performed           Past Medical History:  Diagnosis Date  . Allergy    mild  . Anxiety    mild  . Cancer ([place])    skin cancer- basal cell  . Thyroid disease     Past Surgical History:  Procedure Laterality Date  . basal cell skin cancer removed    . COLONOSCOPY    . POLYPECTOMY      There were no vitals filed for this visit.   Subjective Assessment - 01/14/20 0803    Subjective I think the soft tissue is loosening up and the dry needling is helping    Patient Stated Goals pain in back less to none    Currently in Pain? Yes    Pain Score 3     Pain Location Hip    Pain Orientation Right    Pain Descriptors / Indicators Discomfort    Pain Type Chronic pain    Pain Onset More than a month ago                             [facility] Adult PT Treatment/Exercise - 01/14/20 0001      Lumbar Exercises: Supine   Ab Set 5 reps    Other Supine Lumbar Exercises transverse abdominal and internal oblique activation    Other Supine Lumbar Exercises UE extension isometric and opposite hip extension isometric 5 sec hold 5x each side       Lumbar Exercises: Sidelying   Clam Right;5 reps    Clam Limitations slightly painful before STM and improved after      Lumbar Exercises: Quadruped   Other Quadruped Lumbar Exercises  fire hydrant - 10x      Manual Therapy   Soft tissue mobilization right gluteals and piriformis            Trigger Point Dry Needling - 01/14/20 0001    Consent Given? Yes    Muscles Treated Back/Hip Gluteus minimus;Gluteus medius;Gluteus maximus;Piriformis    Gluteus Minimus Response Palpable increased muscle length    Gluteus Medius Response Palpable increased muscle length    Gluteus Maximus Response Palpable increased muscle length    Piriformis Response Palpable increased muscle length                  PT Short Term Goals - 12/31/19 0926      PT SHORT TERM GOAL #1   Title The patient will report a 30% improvement in right posterior hip pain with sleeping and home and work ADLS    Time 5    Period Weeks    Status New    Target Date 02/04/20      PT SHORT TERM GOAL #2  Title The patient will have improved left sidebending to 30 degrees and right hip internal rotation (measured in prone) to 40 degrees for improved mobility to put on her shoes      PT SHORT TERM GOAL #3   Title The patient will have improved neural mobility with SLR to 50 degrees needed for greater ease with putting on shoes    Time 5    Period Weeks    Status New             PT Long Term Goals - 12/31/19 0930      PT LONG TERM GOAL #1   Title The patient will be independent in self management and safe self progression of HEP    Time 10    Period Weeks    Status New    Target Date 03/10/20      PT LONG TERM GOAL #2   Title The patient will report a 65% improvement in right posterior hip/back pain with sleeping, putting on shoes and home/work ADLs    Time 10    Period Weeks    Status New      PT LONG TERM GOAL #3   Title The patient will have improved right hip abduction, extension, and trunk stability muscles to grossly 5-/5 needed for lifting/carrying heavier objects    Time 10    Period Weeks    Status New      PT LONG TERM GOAL #4   Title The patient's FOTO functional  outcome score improved from 39% limitation to 27%    Time 10    Period Weeks    Status New      PT LONG TERM GOAL #5   Title --                 Plan - 01/14/20 0839    Clinical Impression Statement Pt had some rotation initially with Rt ilium posteriorly rotated.  STM and dry needling brought pelvis back to neutral position.  Pt had no pain with clam after treatment today.  Pt was doing HEP correctly as demonstrated with review and she was able to add nerve glide and exercise progression today.    PT Treatment/Interventions ADLs/Self Care Home Management;Cryotherapy;Electrical Stimulation;Ultrasound;Traction;Moist Heat;Iontophoresis 4mg /ml Dexamethasone;Therapeutic activities;Therapeutic exercise;Neuromuscular re-education;Manual techniques;Patient/family education;Dry needling;Taping;Spinal Manipulations    PT Next Visit Plan assess response to DN #3 of glutes and piriformis;  review and progression of core strengthening as tolerated    PT Home Exercise Plan [email or web address]    Consulted and Agree with Plan of Care Patient           Patient will benefit from skilled therapeutic intervention in order to improve the following deficits and impairments:  Decreased range of motion, Increased fascial restricitons, Pain, Decreased strength  Visit Diagnosis: Chronic right-sided low back pain without sciatica  Muscle weakness (generalized)     Problem List Patient Active Problem List   Diagnosis Date Noted  . ANAL FISSURE 01/25/2010  . HEMORRHAGE OF RECTUM AND ANUS 01/25/2010    Jule Ser, PT 01/14/2020, 8:47 AM  [facility] [address] [place], [place], [address] Phone: [phone]   Fax:  [phone]  Name: Andrea Sharp MRN: [id number] Date of Birth: 04-08-1965

## 2020-01-18 ENCOUNTER — Other Ambulatory Visit: Payer: Self-pay | Admitting: Obstetrics and Gynecology

## 2020-01-18 DIAGNOSIS — Z9189 Other specified personal risk factors, not elsewhere classified: Secondary | ICD-10-CM

## 2020-01-28 ENCOUNTER — Ambulatory Visit: Payer: BC Managed Care – PPO | Attending: Family Medicine | Admitting: Physical Therapy

## 2020-01-28 ENCOUNTER — Other Ambulatory Visit: Payer: Self-pay

## 2020-01-28 DIAGNOSIS — M545 Low back pain, unspecified: Secondary | ICD-10-CM | POA: Diagnosis present

## 2020-01-28 DIAGNOSIS — M6281 Muscle weakness (generalized): Secondary | ICD-10-CM | POA: Diagnosis present

## 2020-01-28 DIAGNOSIS — G8929 Other chronic pain: Secondary | ICD-10-CM | POA: Insufficient documentation

## 2020-01-28 NOTE — Therapy (Signed)
[facility] [address], [place], [place], [address] Phone: [phone]   Fax:  [phone]  Physical Therapy Treatment  Patient Details  Name: Andrea Sharp MRN: [id number] Date of Birth: 05-08-1965 Referring Provider (PT): Dr. Lujean Amel    Encounter Date: 01/28/2020   PT End of Session - 01/28/20 1028    Visit Number 4    Date for PT Re-Evaluation 03/10/20    Authorization Type BCBS    PT Start Time 0930    PT Stop Time 1017    PT Time Calculation (min) 47 min    Activity Tolerance Patient tolerated treatment well           Past Medical History:  Diagnosis Date  . Allergy    mild  . Anxiety    mild  . Cancer ([place])    skin cancer- basal cell  . Thyroid disease     Past Surgical History:  Procedure Laterality Date  . basal cell skin cancer removed    . COLONOSCOPY    . POLYPECTOMY      There were no vitals filed for this visit.   Subjective Assessment - 01/28/20 0933    Subjective Went to [place].  My hip is doing well but the side of my knee hurts.    Pertinent History knee history, ankle history; used to run right hip/knee internal rotation;  shoe wear pattern on  medial side of  shoe    How long can you sit comfortably? try to change a lot at work b/c it may bother    Patient Stated Goals pain in back less to none    Currently in Pain? Yes    Pain Score 3     Pain Location Knee    Pain Orientation Right;Lateral                             [facility] Adult PT Treatment/Exercise - 01/28/20 0001      Knee/Hip Exercises: Standing   Step Down Right;Left;2 sets;5 reps;Hand Hold: 0;Step Height: 2"    Other Standing Knee Exercises green band around thighs, hip hinge with 3 way clams 10x right/left    Other Standing Knee Exercises green band around thighs with monster walks and side steps 10x each       Knee/Hip Exercises: Supine   Other Supine Knee/Hip Exercises green  band around thighs with bridge while doing thoracic rotation 5x each way       Moist Heat Therapy   Number Minutes Moist Heat 3 Minutes    Moist Heat Location Knee      Manual Therapy   Soft tissue mobilization right ITB, lateral quads, lateral HS             Trigger Point Dry Needling - 01/28/20 0001    Consent Given? Yes    Muscles Treated Lower Quadrant Vastus lateralis;Hamstring ITB    Vastus lateralis Response Twitch response elicited;Palpable increased muscle length    Hamstring Response Palpable increased muscle length    Gluteus Minimus Response Palpable increased muscle length                PT Education - 01/28/20 1027    Education Details 2 inch step downs, green band monster walks and side step;  green band bridge with trunk rotation    Person(s) Educated Patient    Methods Explanation;Demonstration;Handout    Comprehension Returned demonstration;Verbalized  understanding            PT Short Term Goals - 01/28/20 1034      PT SHORT TERM GOAL #1   Title The patient will report a 30% improvement in right posterior hip pain with sleeping and home and work ADLS    Status Achieved      PT SHORT TERM GOAL #2   Title The patient will have improved left sidebending to 30 degrees and right hip internal rotation (measured in prone) to 40 degrees for improved mobility to put on her shoes    Status On-going      PT SHORT TERM GOAL #3   Title The patient will have improved neural mobility with SLR to 50 degrees needed for greater ease with putting on shoes    Time 5    Period Weeks    Status On-going             PT Long Term Goals - 12/31/19 0930      PT LONG TERM GOAL #1   Title The patient will be independent in self management and safe self progression of HEP    Time 10    Period Weeks    Status New    Target Date 03/10/20      PT LONG TERM GOAL #2   Title The patient will report a 65% improvement in right posterior hip/back pain with sleeping,  putting on shoes and home/work ADLs    Time 10    Period Weeks    Status New      PT LONG TERM GOAL #3   Title The patient will have improved right hip abduction, extension, and trunk stability muscles to grossly 5-/5 needed for lifting/carrying heavier objects    Time 10    Period Weeks    Status New      PT LONG TERM GOAL #4   Title The patient's FOTO functional outcome score improved from 39% limitation to 27%    Time 10    Period Weeks    Status New      PT LONG TERM GOAL #5   Title --                 Plan - 01/28/20 1011    Clinical Impression Statement The patient's primary complaint today is lateral right knee pain which can be attributed to significant genu valgus/internal rotation and adduction with step downs and sit to stand.  Instructed patient in patellofemoral alignment ex's using mirror for feedback in addition to more challenging glute medius strengthening ex's.   Tender points and taut bands present distal ITB, lateral quads and lateral HS.  Good response with DN and manual therapy.  Therapist monitoring response throughout treatment session with all interventions.    Personal Factors and Comorbidities Time since onset of injury/illness/exacerbation    Examination-Activity Limitations Bend;Sleep;Sit;Lift    Stability/Clinical Decision Making Stable/Uncomplicated    Rehab Potential Good    PT Frequency 1x / week    PT Duration Other (comment)    PT Treatment/Interventions ADLs/Self Care Home Management;Cryotherapy;Electrical Stimulation;Ultrasound;Traction;Moist Heat;Iontophoresis 4mg /ml Dexamethasone;Therapeutic activities;Therapeutic exercise;Neuromuscular re-education;Manual techniques;Patient/family education;Dry needling;Taping;Spinal Manipulations    PT Next Visit Plan check remaining STGS next visit hip ROM and sciatic neural mobility with SLR;  assess response to DN of right ITB and distal quads/HS;  review patellofemoral alignment with step downs and  green band glute med ex's    PT Home Exercise Plan [email or web address]  Patient will benefit from skilled therapeutic intervention in order to improve the following deficits and impairments:  Decreased range of motion, Increased fascial restricitons, Pain, Decreased strength  Visit Diagnosis: Chronic right-sided low back pain without sciatica  Muscle weakness (generalized)     Problem List Patient Active Problem List   Diagnosis Date Noted  . ANAL FISSURE 01/25/2010  . HEMORRHAGE OF RECTUM AND ANUS 01/25/2010   Ruben Im, PT 01/28/20 10:36 AM Phone: [phone] Fax: [phone] Alvera Singh 01/28/2020, 10:36 AM  [facility] [address] [place], [place], [address] Phone: [phone]   Fax:  [phone]  Name: Andrea Sharp MRN: [id number] Date of Birth: October 07, 1965

## 2020-01-28 NOTE — Patient Instructions (Signed)
Access Code: [email or web address] URL: [email or web address] Date: 01/28/2020 Prepared by: Ruben Im  Exercises Supine Transversus Abdominis Bracing - Hands on Stomach - 1 x daily - 7 x weekly - 1 sets - 5 reps Supine Pursed Lip Breathing - 1 x daily - 7 x weekly - 1 sets - 5 reps Supine ASLR with Ab Bracing and Shoulder Extension with Anchored Resistance - 1 x daily - 7 x weekly - 1 sets - 5 reps Prone Hip Extension - One Pillow - 1 x daily - 7 x weekly - 1 sets - 10 reps Single Arm Doorway Pec Stretch at 120 Degrees Abduction - 1 x daily - 7 x weekly - 2 sets - 5 reps Quadruped Hip Abduction and External Rotation - 1 x daily - 7 x weekly - 1 sets - 10 reps Supine Sciatic Nerve Glide - 1 x daily - 7 x weekly - 3 sets - 10 reps Forward Step Down - 1 x daily - 7 x weekly - 1 sets - 10 reps Side Stepping with Resistance at Thighs - 1 x daily - 7 x weekly - 3 sets - 10 reps Supine Bridging with Alternating Band Pulls with Resistance at Head - 1 x daily - 7 x weekly - 1 sets - 10 reps

## 2020-02-04 ENCOUNTER — Ambulatory Visit: Payer: BC Managed Care – PPO | Admitting: Physical Therapy

## 2020-02-04 ENCOUNTER — Other Ambulatory Visit: Payer: Self-pay

## 2020-02-04 DIAGNOSIS — M6281 Muscle weakness (generalized): Secondary | ICD-10-CM

## 2020-02-04 DIAGNOSIS — M545 Low back pain, unspecified: Secondary | ICD-10-CM

## 2020-02-04 DIAGNOSIS — G8929 Other chronic pain: Secondary | ICD-10-CM

## 2020-02-04 NOTE — Therapy (Signed)
[facility] [address], [place], [place], [address] Phone: [phone]   Fax:  [phone]  Physical Therapy Treatment  Patient Details  Name: Andrea Sharp MRN: [id number] Date of Birth: Dec 22, 1965 Referring Provider (PT): Dr. Lujean Amel    Encounter Date: 02/04/2020   PT End of Session - 02/04/20 2105    Visit Number 5    Date for PT Re-Evaluation 03/10/20    Authorization Type BCBS    PT Start Time 1100    PT Stop Time 1140    PT Time Calculation (min) 40 min    Activity Tolerance Patient tolerated treatment well           Past Medical History:  Diagnosis Date  . Allergy    mild  . Anxiety    mild  . Cancer ([place])    skin cancer- basal cell  . Thyroid disease     Past Surgical History:  Procedure Laterality Date  . basal cell skin cancer removed    . COLONOSCOPY    . POLYPECTOMY      There were no vitals filed for this visit.   Subjective Assessment - 02/04/20 1103    Subjective I felt unsure with my ability to keep my knee in alignment.  The hip has stayed pretty good.  I also see a PT for pelvic floor.    Pertinent History knee history, ankle history; used to run right hip/knee internal rotation;  shoe wear pattern on  medial side of  shoe    Currently in Pain? Yes    Pain Score 2     Pain Location Knee    Pain Orientation Right              [facility] PT Assessment - 02/04/20 0001      AROM   Overall AROM Comments full and painless lumbar ROM      Straight Leg Raise   Side  Right    Comment 70 degrees, 75 degrees left                         [facility] Adult PT Treatment/Exercise - 02/04/20 0001      Lumbar Exercises: Stretches   ITB Stretch Right;3 reps;20 seconds    ITB Stretch Limitations standing      Lumbar Exercises: Seated   Other Seated Lumbar Exercises review of HEP      Knee/Hip Exercises: Standing   Step Down Right;Left;2 sets;5 reps;Hand Hold:  0;Step Height: 2"    Other Standing Knee Exercises step forward and back over 2 inch step 10x      Knee/Hip Exercises: Supine   Other Supine Knee/Hip Exercises ilipsoas SLR 5x right      Manual Therapy   Joint Mobilization right long axis distraction grade 3/4 3 min; AP in slight interal rotation grade 3/4 30sec 3x; prone PA hip    Soft tissue mobilization right ITB, lateral quads, lateral HS             Trigger Point Dry Needling - 02/04/20 0001    Consent Given? Yes    Muscles Treated Lower Quadrant Vastus lateralis;Hamstring    Vastus lateralis Response Twitch response elicited;Palpable increased muscle length    Hamstring Response Palpable increased muscle length    Gluteus Minimus Response Palpable increased muscle length                  PT Short Term Goals -  02/04/20 2110      PT SHORT TERM GOAL #1   Title The patient will report a 30% improvement in right posterior hip pain with sleeping and home and work ADLS    Status Achieved      PT [place] #2   Title The patient will have improved left sidebending to 30 degrees and right hip internal rotation (measured in prone) to 40 degrees for improved mobility to put on her shoes    Status Achieved      PT SHORT TERM GOAL #3   Title The patient will have improved neural mobility with SLR to 50 degrees needed for greater ease with putting on shoes    Status Achieved             PT Long Term Goals - 12/31/19 0930      PT LONG TERM GOAL #1   Title The patient will be independent in self management and safe self progression of HEP    Time 10    Period Weeks    Status New    Target Date 03/10/20      PT LONG TERM GOAL #2   Title The patient will report a 65% improvement in right posterior hip/back pain with sleeping, putting on shoes and home/work ADLs    Time 10    Period Weeks    Status New      PT LONG TERM GOAL #3   Title The patient will have improved right hip abduction, extension, and trunk  stability muscles to grossly 5-/5 needed for lifting/carrying heavier objects    Time 10    Period Weeks    Status New      PT LONG TERM GOAL #4   Title The patient's FOTO functional outcome score improved from 39% limitation to 27%    Time 10    Period Weeks    Status New      PT LONG TERM GOAL #5   Title --                 Plan - 02/04/20 2106    Clinical Impression Statement Negative SLR or neural signs.  LBP and hip symptom free and decreasing lateral knee pain.  Improving awareness of patellofemoral alignment although difficulty with avoiding hip adduction on a full size step down.  Good ilipsoas strength but persistent glute medius weakness.  Decreased overall tender points in ITB.    Examination-Participation Restrictions Occupation;Other    Stability/Clinical Decision Making Stable/Uncomplicated    Rehab Potential Good    PT Frequency 1x / week    PT Duration Other (comment)    PT Treatment/Interventions ADLs/Self Care Home Management;Cryotherapy;Electrical Stimulation;Ultrasound;Traction;Moist Heat;Iontophoresis 4mg /ml Dexamethasone;Therapeutic activities;Therapeutic exercise;Neuromuscular re-education;Manual techniques;Patient/family education;Dry needling;Taping;Spinal Manipulations    PT Next Visit Plan DN of right ITB and distal quads/HS;   patellofemoral alignment with step downs and green band glute med ex's    PT Home Exercise Plan [email or web address]           Patient will benefit from skilled therapeutic intervention in order to improve the following deficits and impairments:  Decreased range of motion,Increased fascial restricitons,Pain,Decreased strength  Visit Diagnosis: Chronic right-sided low back pain without sciatica  Muscle weakness (generalized)     Problem List Patient Active Problem List   Diagnosis Date Noted  . ANAL FISSURE 01/25/2010  . HEMORRHAGE OF RECTUM AND ANUS 01/25/2010   Ruben Im, PT 02/04/20 9:11 PM Phone: [phone] Fax:  [phone] Alvera Singh  02/04/2020, 9:11 PM  [facility] [address] [place], [place], [address] Phone: [phone]   Fax:  [phone]  Name: Andrea Sharp MRN: [id number] Date of Birth: [birth year]

## 2020-02-11 ENCOUNTER — Ambulatory Visit: Payer: BC Managed Care – PPO | Admitting: Physical Therapy

## 2020-02-11 ENCOUNTER — Other Ambulatory Visit: Payer: Self-pay

## 2020-02-11 ENCOUNTER — Encounter: Payer: Self-pay | Admitting: Physical Therapy

## 2020-02-11 DIAGNOSIS — M545 Low back pain, unspecified: Secondary | ICD-10-CM

## 2020-02-11 DIAGNOSIS — M6281 Muscle weakness (generalized): Secondary | ICD-10-CM

## 2020-02-11 NOTE — Therapy (Signed)
[facility] [address] [place], [place], [address] Phone: [phone]   Fax:  [phone]  Physical Therapy Treatment  Patient Details  Name: Andrea Sharp MRN: [id number] Date of Birth: 28-Sep-1965 Referring Provider (PT): Dr. Lujean Amel    Encounter Date: 02/11/2020   PT End of Session - 02/11/20 1105    Visit Number 6    Date for PT Re-Evaluation 03/10/20    Authorization Type BCBS    PT Start Time 0845    PT Stop Time 0924    PT Time Calculation (min) 39 min    Activity Tolerance Patient tolerated treatment well    Behavior During Therapy [facility] for tasks assessed/performed           Past Medical History:  Diagnosis Date  . Allergy    mild  . Anxiety    mild  . Cancer ([place])    skin cancer- basal cell  . Thyroid disease     Past Surgical History:  Procedure Laterality Date  . basal cell skin cancer removed    . COLONOSCOPY    . POLYPECTOMY      There were no vitals filed for this visit.   Subjective Assessment - 02/11/20 0852    Subjective I have some discomfort when going down the stairs.  Still really have to focus on keeping it straight    Patient Stated Goals pain in back less to none    Currently in Pain? Yes    Pain Score 2     Pain Location Knee    Pain Orientation Right                             [facility] Adult PT Treatment/Exercise - 02/11/20 0001      Lumbar Exercises: Standing   Other Standing Lumbar Exercises side step green band; green band around thighs with squats; wide stance squats    Other Standing Lumbar Exercises stairs with cue for LE alignment      Knee/Hip Exercises: Standing   Abduction Limitations side step with yellow loop    Functional Squat 10 reps    Functional Squat Limitations squat with band around thighs    Other Standing Knee Exercises wide stance squat with toes out    Other Standing Knee Exercises up and down steps before and  after STM - improved afterwards and able to stabilize LE better and less pain      Manual Therapy   Soft tissue mobilization right ITB, lateral quads, lateral HS, lateral quad tendon, TFL            Trigger Point Dry Needling - 02/11/20 0001    Consent Given? Yes    Muscles Treated Back/Hip Tensor fascia lata    Vastus lateralis Response Twitch response elicited;Palpable increased muscle length    Hamstring Response Twitch response elicited;Palpable increased muscle length    Tensor Fascia Lata Response Twitch response elicited;Palpable increased muscle length                  PT Short Term Goals - 02/04/20 2110      PT SHORT TERM GOAL #1   Title The patient will report a 30% improvement in right posterior hip pain with sleeping and home and work ADLS    Status Achieved      PT [place] #2   Title The patient will have improved left sidebending to  30 degrees and right hip internal rotation (measured in prone) to 40 degrees for improved mobility to put on her shoes    Status Achieved      PT SHORT TERM GOAL #3   Title The patient will have improved neural mobility with SLR to 50 degrees needed for greater ease with putting on shoes    Status Achieved             PT Long Term Goals - 02/11/20 1118      PT LONG TERM GOAL #1   Title The patient will be independent in self management and safe self progression of HEP    Status On-going      PT LONG TERM GOAL #2   Title The patient will report a 65% improvement in right posterior hip/back pain with sleeping, putting on shoes and home/work ADLs    Baseline states it is better in the back but knee is hurting more                 Plan - 02/11/20 1118    Clinical Impression Statement Pt tolerated treatment well and was focused on soft tissue release and glute med strengthening.  Pt had increased soft tissue length of the quad and IT band.  Pt was given instructions on foam rolling or using roller to quad  and self massage for quad tendon.  Pt felt less pain on stairs afte today's treatment.  Continue with skilled PT for LE and gluteal strengthening    PT Treatment/Interventions ADLs/Self Care Home Management;Cryotherapy;Electrical Stimulation;Ultrasound;Traction;Moist Heat;Iontophoresis 4mg /ml Dexamethasone;Therapeutic activities;Therapeutic exercise;Neuromuscular re-education;Manual techniques;Patient/family education;Dry needling;Taping;Spinal Manipulations    PT Next Visit Plan continue DN of right ITB and distal quads/HS as needed;   patellofemoral alignment with step downs and green band glute med ex's, review new exercises in HEP as needed    PT Home Exercise Plan [id number]    Consulted and Agree with Plan of Care Patient           Patient will benefit from skilled therapeutic intervention in order to improve the following deficits and impairments:  Decreased range of motion,Increased fascial restricitons,Pain,Decreased strength  Visit Diagnosis: Chronic right-sided low back pain without sciatica  Muscle weakness (generalized)     Problem List Patient Active Problem List   Diagnosis Date Noted  . ANAL FISSURE 01/25/2010  . HEMORRHAGE OF RECTUM AND ANUS 01/25/2010    Jule Ser, PT 02/11/2020, 3:51 PM  [facility] [address], [place], [place], [address] Phone: [phone]   Fax:  [phone]  Name: Andrea Sharp MRN: [id number] Date of Birth: 14-Oct-1965

## 2020-02-16 ENCOUNTER — Other Ambulatory Visit: Payer: Self-pay

## 2020-02-16 ENCOUNTER — Ambulatory Visit: Payer: BC Managed Care – PPO | Admitting: Physical Therapy

## 2020-02-16 DIAGNOSIS — M6281 Muscle weakness (generalized): Secondary | ICD-10-CM

## 2020-02-16 DIAGNOSIS — M545 Low back pain, unspecified: Secondary | ICD-10-CM | POA: Diagnosis not present

## 2020-02-16 DIAGNOSIS — G8929 Other chronic pain: Secondary | ICD-10-CM

## 2020-02-16 NOTE — Therapy (Signed)
[facility] [address] [place], [place], [address] Phone: [phone]   Fax:  [phone]  Physical Therapy Treatment/Discharge Summary   Patient Details  Name: Andrea Sharp MRN: [id number] Date of Birth: 09-20-1965 Referring Provider (PT): Dr. Lujean Amel    Encounter Date: 02/16/2020   PT End of Session - 02/16/20 1750    Visit Number 7    Date for PT Re-Evaluation 03/10/20    Authorization Type BCBS    PT Start Time 0845    PT Stop Time 0925    PT Time Calculation (min) 40 min    Activity Tolerance Patient tolerated treatment well           Past Medical History:  Diagnosis Date  . Allergy    mild  . Anxiety    mild  . Cancer ([place])    skin cancer- basal cell  . Thyroid disease     Past Surgical History:  Procedure Laterality Date  . basal cell skin cancer removed    . COLONOSCOPY    . POLYPECTOMY      There were no vitals filed for this visit.   Subjective Assessment - 02/16/20 0849    Subjective My hips have been feeling good for a while.  Did some yoga yesterday without difficulty.  The knee is better but feels a little puffy in the front.    Pertinent History knee history, ankle history; used to run right hip/knee internal rotation;  shoe wear pattern on  medial side of  shoe    Limitations House hold activities;Sitting    How long can you sit comfortably? try to change a lot at work b/c it may bother    Currently in Pain? Yes    Pain Score 1     Pain Location Knee    Pain Orientation Right              [facility] PT Assessment - 02/16/20 0001      Observation/Other Assessments   Focus on Therapeutic Outcomes (FOTO)  17% limitation      AROM   Overall AROM Comments right hip external rotation 50 degrees;  SLR 80 degrees    Lumbar Flexion 75    Lumbar Extension 35    Lumbar - Right Side Bend 40    Lumbar - Left Side Bend 40      Strength   Right Hip Extension 5/5    Right  Hip ABduction 5/5    Left Hip Extension 5/5    Left Hip ABduction 5/5    Lumbar Flexion 5/5    Lumbar Extension 5/5                         [facility] Adult PT Treatment/Exercise - 02/16/20 0001      Self-Care   Self-Care Other Self-Care Comments    ADL's sleep positioning    Other Self-Care Comments  use of Addaday (massage gun) along ITB for home relief      Lumbar Exercises: Standing   Other Standing Lumbar Exercises comprehensive review of standing ex's: step downs, squats      Lumbar Exercises: Seated   Other Seated Lumbar Exercises toe intrinsic strengthening for lateral foot pain      Lumbar Exercises: Quadruped   Other Quadruped Lumbar Exercises add band to fire hydrant      Knee/Hip Exercises: Standing   Other Standing Knee Exercises review of complete HEP with  discussion of progression and variations                    PT Short Term Goals - 02/16/20 1755      PT SHORT TERM GOAL #1   Title The patient will report a 30% improvement in right posterior hip pain with sleeping and home and work ADLS    Status Achieved      PT SHORT TERM GOAL #2   Title The patient will have improved left sidebending to 30 degrees and right hip internal rotation (measured in prone) to 40 degrees for improved mobility to put on her shoes    Status Achieved      PT SHORT TERM GOAL #3   Title The patient will have improved neural mobility with SLR to 50 degrees needed for greater ease with putting on shoes    Status Achieved             PT Long Term Goals - 02/16/20 0900      PT LONG TERM GOAL #1   Title The patient will be independent in self management and safe self progression of HEP    Status Achieved      PT LONG TERM GOAL #2   Title The patient will report a 65% improvement in right posterior hip/back pain with sleeping, putting on shoes and home/work ADLs    Baseline 90%    Status Achieved      PT LONG TERM GOAL #3   Title The patient will have  improved right hip abduction, extension, and trunk stability muscles to grossly 5-/5 needed for lifting/carrying heavier objects    Status Achieved      PT LONG TERM GOAL #4   Title The patient's FOTO functional outcome score improved from 39% limitation to 27%    Baseline 17%    Status Achieved                 Plan - 02/16/20 1750    Clinical Impression Statement The patient reports significant improvements in hip pain since start of care and decreasing right lateral thigh pain which developed more recently.  She rates her improvement at 90% better.  Her FOTO functional outcome measure score improved from a 39% limitation to only 17%.  She has full lumbar ROM and symmetrical hip ROM.  No neural signs with full and now painless SLR.  Her hip and core strength grossly 5/5.  She has met all rehab goals and she is in agreement for discharge to a home ex program.           Patient will benefit from skilled therapeutic intervention in order to improve the following deficits and impairments:     Visit Diagnosis: Chronic right-sided low back pain without sciatica  Muscle weakness (generalized)     PHYSICAL THERAPY DISCHARGE SUMMARY  Visits from Start of Care: 7  Current functional level related to goals / functional outcomes: See clinical impressions above   Remaining deficits: As above   Education / Equipment: HEP Plan: Patient agrees to discharge.  Patient goals were met. Patient is being discharged due to meeting the stated rehab goals.  ?????        Problem List Patient Active Problem List   Diagnosis Date Noted  . ANAL FISSURE 01/25/2010  . HEMORRHAGE OF RECTUM AND ANUS 01/25/2010   Ruben Im, PT 02/16/20 5:56 PM Phone: [phone] Fax: [phone] Alvera Singh 02/16/2020, 5:55 PM  [facility] [address] [place], [place], [address] Phone: [phone]   Fax:   [phone]  Name: Evelyn Aguinaldo MRN: [id number] Date of Birth: 1965-12-28

## 2020-07-29 ENCOUNTER — Ambulatory Visit
Admission: RE | Admit: 2020-07-29 | Discharge: 2020-07-29 | Disposition: A | Discharge status: Home / Self Care | Payer: BC Managed Care – PPO | Source: Ambulatory Visit | Attending: Obstetrics and Gynecology | Admitting: Obstetrics and Gynecology

## 2020-07-29 DIAGNOSIS — Z9189 Other specified personal risk factors, not elsewhere classified: Secondary | ICD-10-CM

## 2020-07-29 MED ORDER — GADOBUTROL 1 MMOL/ML IV SOLN
7.0000 mL | Freq: Once | INTRAVENOUS | Status: AC | PRN
  Administered 2020-07-29: 7 mL via INTRAVENOUS

## 2021-03-07 DIAGNOSIS — E039 Hypothyroidism, unspecified: Secondary | ICD-10-CM | POA: Diagnosis not present

## 2021-03-16 DIAGNOSIS — Z01419 Encounter for gynecological examination (general) (routine) without abnormal findings: Secondary | ICD-10-CM | POA: Diagnosis not present

## 2021-03-16 DIAGNOSIS — Z6824 Body mass index (BMI) 24.0-24.9, adult: Secondary | ICD-10-CM | POA: Diagnosis not present

## 2021-03-16 DIAGNOSIS — Z9189 Other specified personal risk factors, not elsewhere classified: Secondary | ICD-10-CM | POA: Diagnosis not present

## 2021-03-16 DIAGNOSIS — Z1231 Encounter for screening mammogram for malignant neoplasm of breast: Secondary | ICD-10-CM | POA: Diagnosis not present

## 2021-03-20 ENCOUNTER — Other Ambulatory Visit: Payer: Self-pay | Admitting: Obstetrics and Gynecology

## 2021-03-20 DIAGNOSIS — Z1239 Encounter for other screening for malignant neoplasm of breast: Secondary | ICD-10-CM

## 2021-06-26 ENCOUNTER — Ambulatory Visit
Admission: RE | Admit: 2021-06-26 | Discharge: 2021-06-26 | Disposition: A | Discharge status: Home / Self Care | Payer: BC Managed Care – PPO | Source: Ambulatory Visit | Attending: Obstetrics and Gynecology | Admitting: Obstetrics and Gynecology

## 2021-06-26 DIAGNOSIS — Z803 Family history of malignant neoplasm of breast: Secondary | ICD-10-CM | POA: Diagnosis not present

## 2021-06-26 DIAGNOSIS — Z1239 Encounter for other screening for malignant neoplasm of breast: Secondary | ICD-10-CM

## 2021-06-26 MED ORDER — GADOBUTROL 1 MMOL/ML IV SOLN
8.0000 mL | Freq: Once | INTRAVENOUS | Status: AC | PRN
  Administered 2021-06-26: 8 mL via INTRAVENOUS

## 2021-12-11 DIAGNOSIS — Z13220 Encounter for screening for lipoid disorders: Secondary | ICD-10-CM | POA: Diagnosis not present

## 2021-12-11 DIAGNOSIS — K429 Umbilical hernia without obstruction or gangrene: Secondary | ICD-10-CM | POA: Diagnosis not present

## 2021-12-11 DIAGNOSIS — Z0000 Encounter for general adult medical examination without abnormal findings: Secondary | ICD-10-CM | POA: Diagnosis not present

## 2021-12-11 DIAGNOSIS — Z23 Encounter for immunization: Secondary | ICD-10-CM | POA: Diagnosis not present

## 2021-12-11 DIAGNOSIS — E039 Hypothyroidism, unspecified: Secondary | ICD-10-CM | POA: Diagnosis not present

## 2021-12-11 DIAGNOSIS — Z131 Encounter for screening for diabetes mellitus: Secondary | ICD-10-CM | POA: Diagnosis not present

## 2021-12-11 DIAGNOSIS — F321 Major depressive disorder, single episode, moderate: Secondary | ICD-10-CM | POA: Diagnosis not present

## 2021-12-18 DIAGNOSIS — Z85828 Personal history of other malignant neoplasm of skin: Secondary | ICD-10-CM | POA: Diagnosis not present

## 2021-12-18 DIAGNOSIS — D2261 Melanocytic nevi of right upper limb, including shoulder: Secondary | ICD-10-CM | POA: Diagnosis not present

## 2021-12-18 DIAGNOSIS — D2262 Melanocytic nevi of left upper limb, including shoulder: Secondary | ICD-10-CM | POA: Diagnosis not present

## 2021-12-18 DIAGNOSIS — D225 Melanocytic nevi of trunk: Secondary | ICD-10-CM | POA: Diagnosis not present

## 2021-12-18 DIAGNOSIS — L570 Actinic keratosis: Secondary | ICD-10-CM | POA: Diagnosis not present

## 2023-09-29 IMAGING — MR MR BREAST BILAT WO/W CM
8 of 12 series · 33 of 48 positions shown · IV contrast (8 ml gadavist)
Comparison: Previous exam(s).

CLINICAL DATA: 56-year-old female presenting for high risk
screening MRI. She is family history in her mother in her 60s and
paternal grandmother in her 60s. She has personal history of skin
cancer.

EXAM:
BILATERAL BREAST MRI WITH AND WITHOUT CONTRAST
TECHNIQUE: Multiplanar, multisequence MR images of both breasts were obtained
prior to and following the intravenous administration of 8 ml of
Gadavist

[Series 2: t2_tirm_tra ipat (a-p) · axial · 3.0mm · 0.70mm/px · 1 of 55 slices shown]
[im 1/55]
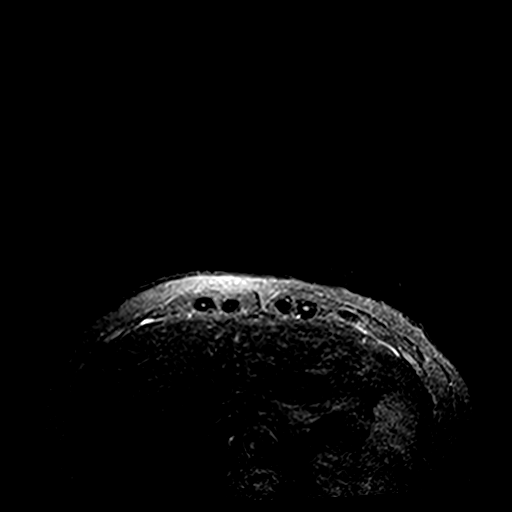

[Series 3: fl3d pre-cm no · axial · non-contrast · 1.2mm · 0.94mm/px · z∈[-75,+96]mm · 5 of 144 slices shown]
[im 1/144]
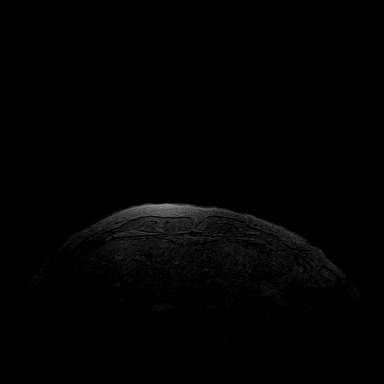
[im 36/144]
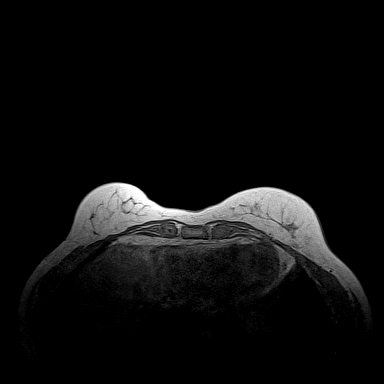
[im 72/144]
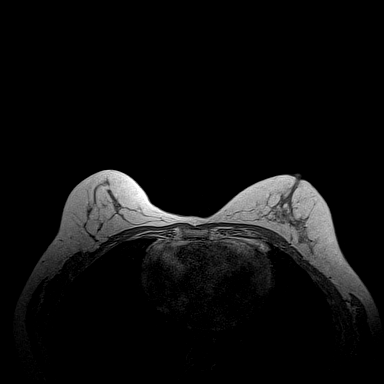
[im 108/144]
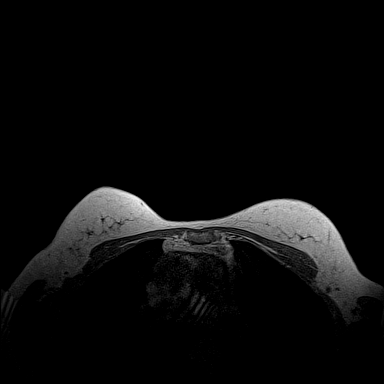
[im 144/144]
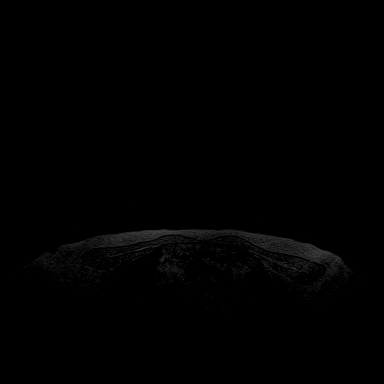

[Series 4: fl3d pre-cm · axial · non-contrast · 1.2mm · 0.94mm/px · z∈[-75,+96]mm · 5 of 144 slices shown]
[im 1/144]
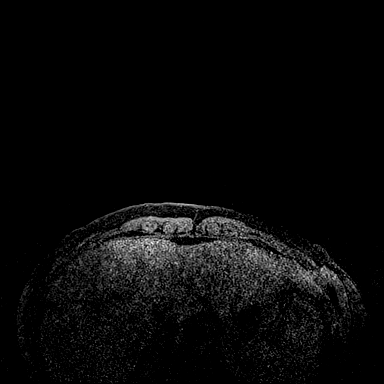
[im 36/144]
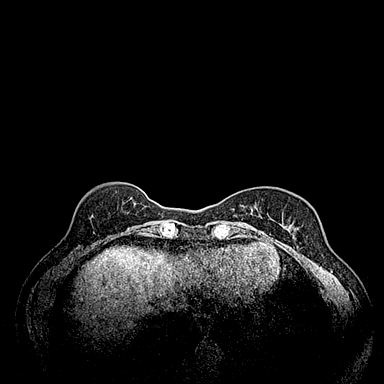
[im 72/144]
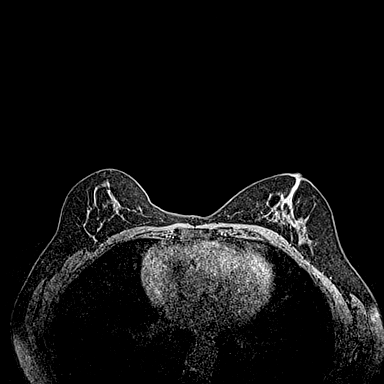
[im 108/144]
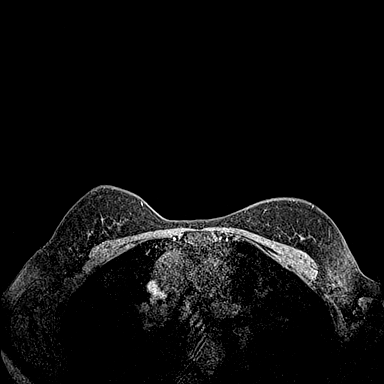
[im 144/144]
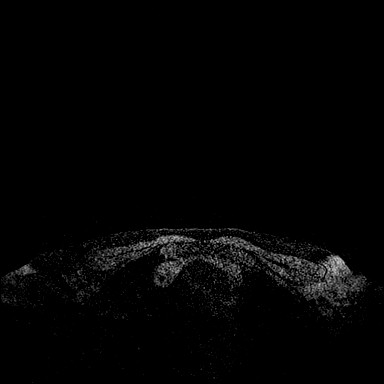

[Series 5: fl3d post-cm 20 · axial · 1.2mm · 0.94mm/px · z∈[-75,+96]mm · 5 of 144 slices shown (1 of 3)]
[im 1/144]
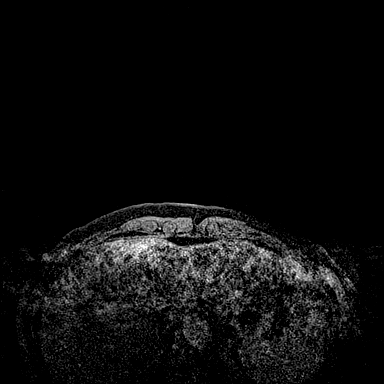
[im 36/144]
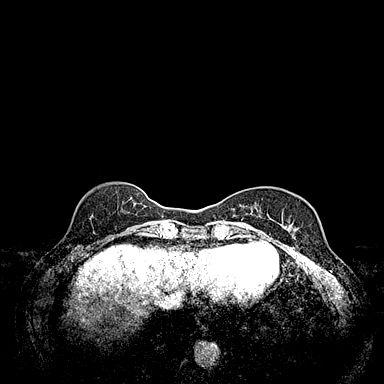
[im 72/144]
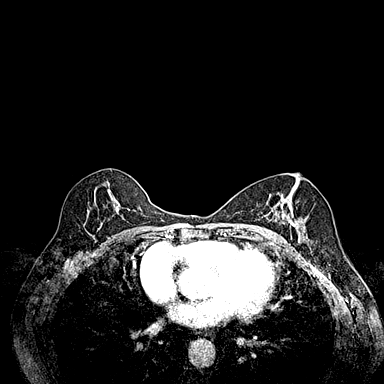
[im 108/144]
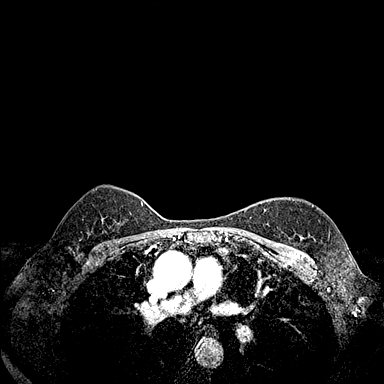
[im 144/144]
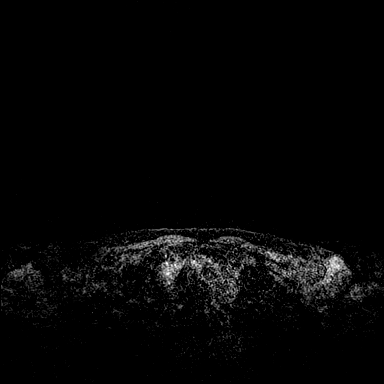

[Series 6: fl3d post-cm 20 · axial · 1.2mm · 0.94mm/px · z∈[-75,+96]mm · 5 of 144 slices shown (2 of 3)]
[im 1/144]
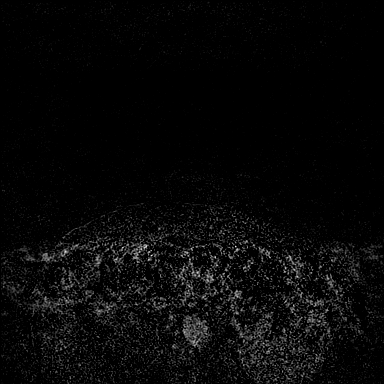
[im 36/144]
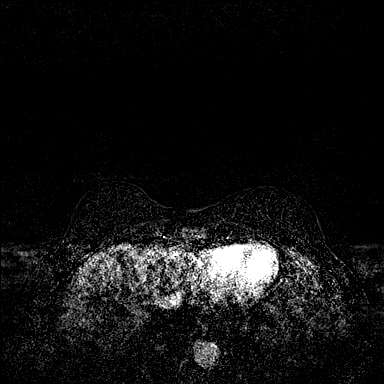
[im 72/144]
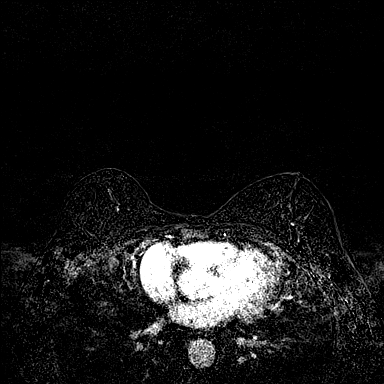
[im 108/144]
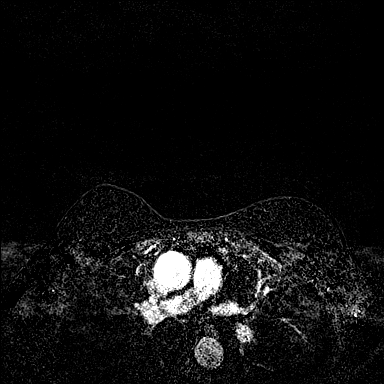
[im 144/144]
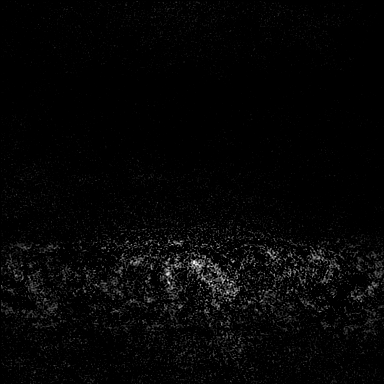

[Series 7: fl3d post-cm 20 · axial · 172.8mm · 0.94mm/px · 1 of 1 slices shown (3 of 3)]
[im 1/1]
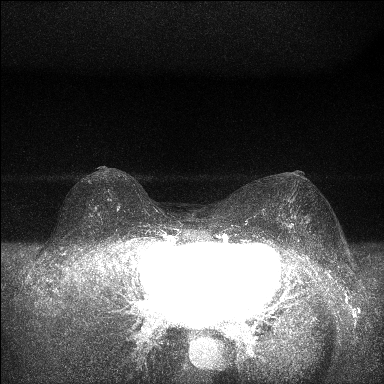

[Series 8: fl3d post-cm 3 · axial · 1.2mm · 0.94mm/px · z∈[-75,+96]mm · 6 of 144 slices shown (1 of 2)]
[im 1/144]
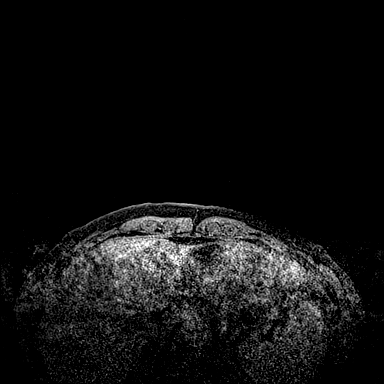
[im 29/144]
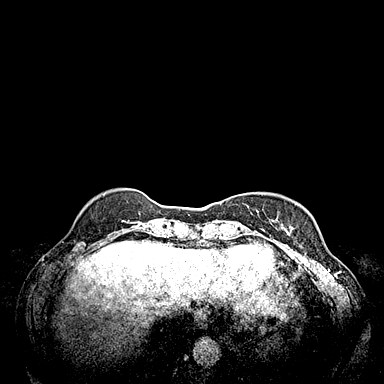
[im 58/144]
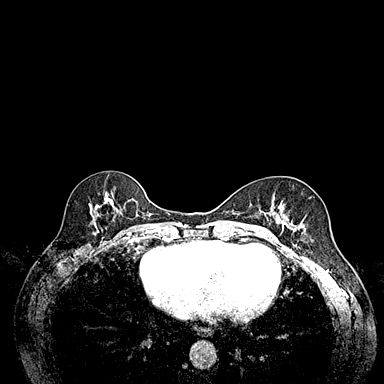
[im 86/144]
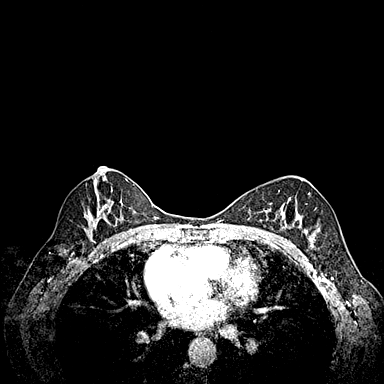
[im 115/144]
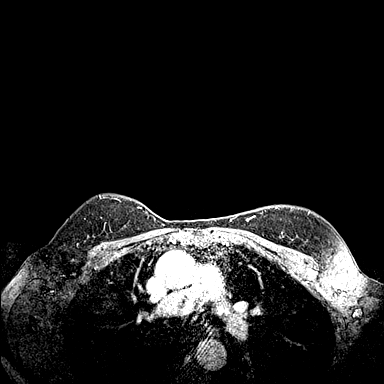
[im 144/144]
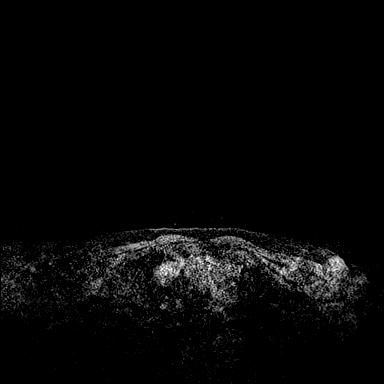

[Series 9: fl3d post-cm 3 · axial · 1.2mm · 0.94mm/px · z∈[-75,+61]mm · 5 of 144 slices shown (2 of 2)]
[im 1/144]
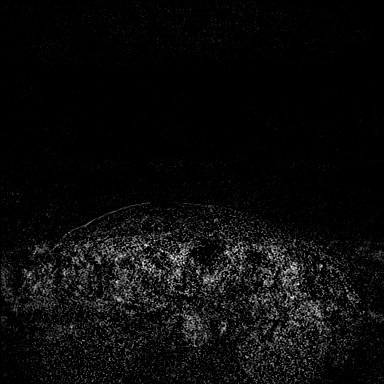
[im 29/144]
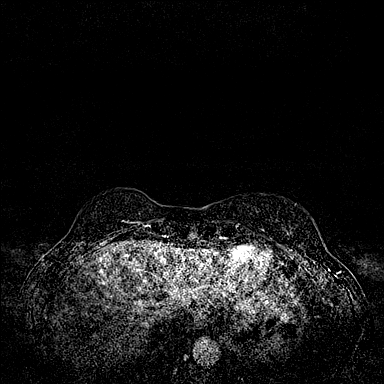
[im 58/144]
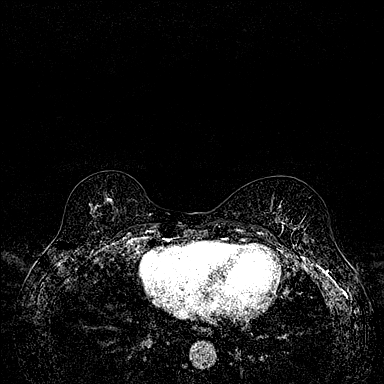
[im 86/144]
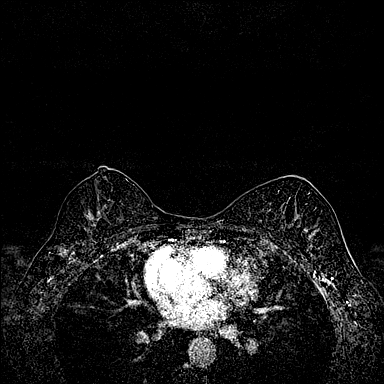
[im 115/144]
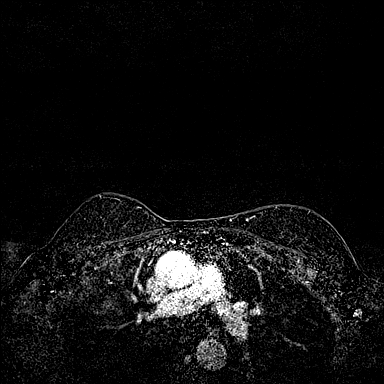

[33 of 48 positions shown; findings below may reference images not displayed]

Three-dimensional MR images were rendered by post-processing of the
original MR data on an independent workstation. The
three-dimensional MR images were interpreted, and findings are
reported in the following complete MRI report for this study. Three
dimensional images were evaluated at the independent interpreting
workstation using the DynaCAD thin client.
FINDINGS: Breast composition: c. Heterogeneous fibroglandular tissue.

Background parenchymal enhancement: Minimal.

Right breast: No mass or abnormal enhancement.

Left breast: No mass or abnormal enhancement.

Lymph nodes: No abnormal appearing lymph nodes.

Ancillary findings:  None.
IMPRESSION: No MRI evidence of malignancy in the bilateral breasts.

RECOMMENDATION:
1.  Annual screening mammography due in Saturday February, 2022.

2.  High risk screening MRI in 1 year.

BI-RADS CATEGORY  1: Negative.

## 2024-01-09 ENCOUNTER — Other Ambulatory Visit (HOSPITAL_COMMUNITY): Payer: Self-pay | Admitting: *Deleted

## 2024-01-09 DIAGNOSIS — E7800 Pure hypercholesterolemia, unspecified: Secondary | ICD-10-CM

## 2024-01-13 ENCOUNTER — Ambulatory Visit (HOSPITAL_COMMUNITY)
Admission: RE | Admit: 2024-01-13 | Discharge: 2024-01-13 | Disposition: A | Discharge status: Home / Self Care | Payer: Self-pay | Source: Ambulatory Visit | Attending: Cardiology | Admitting: Cardiology

## 2024-01-13 DIAGNOSIS — E7800 Pure hypercholesterolemia, unspecified: Secondary | ICD-10-CM | POA: Insufficient documentation
